# Patient Record
Sex: Female | Born: 1985 | Race: Asian | Hispanic: No | Marital: Single | Smoking: Never smoker
Health system: Southern US, Community
[De-identification: ages and names within clinical notes are randomized; demographics above are authoritative.]

## PROBLEM LIST (undated history)

## (undated) DIAGNOSIS — Z789 Other specified health status: Secondary | ICD-10-CM

## (undated) DIAGNOSIS — G039 Meningitis, unspecified: Secondary | ICD-10-CM

## (undated) HISTORY — PX: NO PAST SURGERIES: SHX2092

---

## 2009-09-27 ENCOUNTER — Emergency Department (HOSPITAL_COMMUNITY): Admission: EM | Admit: 2009-09-27 | Discharge: 2009-09-27 | Payer: Self-pay | Admitting: Emergency Medicine

## 2010-05-18 ENCOUNTER — Emergency Department (HOSPITAL_COMMUNITY): Admission: EM | Admit: 2010-05-18 | Discharge: 2010-05-18 | Payer: Self-pay | Admitting: Family Medicine

## 2010-05-24 ENCOUNTER — Ambulatory Visit: Payer: Self-pay | Admitting: Internal Medicine

## 2010-05-24 ENCOUNTER — Inpatient Hospital Stay (HOSPITAL_COMMUNITY): Admission: EM | Admit: 2010-05-24 | Discharge: 2010-05-27 | Payer: Self-pay | Admitting: Emergency Medicine

## 2010-06-04 ENCOUNTER — Ambulatory Visit: Payer: Self-pay | Admitting: Internal Medicine

## 2010-07-23 ENCOUNTER — Ambulatory Visit: Payer: Self-pay | Admitting: Internal Medicine

## 2010-07-23 ENCOUNTER — Encounter (INDEPENDENT_AMBULATORY_CARE_PROVIDER_SITE_OTHER): Payer: Self-pay | Admitting: Family Medicine

## 2010-07-23 LAB — CONVERTED CEMR LAB: GC Probe Amp, Genital: NEGATIVE

## 2010-09-27 ENCOUNTER — Emergency Department (HOSPITAL_COMMUNITY)
Admission: EM | Admit: 2010-09-27 | Discharge: 2010-09-27 | Payer: Self-pay | Source: Home / Self Care | Admitting: Emergency Medicine

## 2010-09-29 ENCOUNTER — Emergency Department (HOSPITAL_COMMUNITY)
Admission: EM | Admit: 2010-09-29 | Discharge: 2010-09-29 | Payer: Self-pay | Source: Home / Self Care | Admitting: Emergency Medicine

## 2010-11-21 ENCOUNTER — Inpatient Hospital Stay (INDEPENDENT_AMBULATORY_CARE_PROVIDER_SITE_OTHER)
Admission: RE | Admit: 2010-11-21 | Discharge: 2010-11-21 | Disposition: A | Discharge status: Home / Self Care | Payer: Self-pay | Source: Ambulatory Visit | Attending: Family Medicine | Admitting: Family Medicine

## 2010-11-21 DIAGNOSIS — R300 Dysuria: Secondary | ICD-10-CM

## 2010-11-21 LAB — POCT URINALYSIS DIPSTICK
Ketones, ur: NEGATIVE mg/dL
Nitrite: NEGATIVE
Protein, ur: 100 mg/dL — AB
Specific Gravity, Urine: 1.025 (ref 1.005–1.030)
Urine Glucose, Fasting: NEGATIVE mg/dL

## 2010-11-23 LAB — URINE CULTURE
Colony Count: >=100000
Culture  Setup Time: 201202192139

## 2010-12-13 LAB — BASIC METABOLIC PANEL
BUN: 6 mg/dL (ref 6–23)
Chloride: 107 mEq/L (ref 96–112)
Creatinine, Ser: 0.69 mg/dL (ref 0.4–1.2)
GFR calc Af Amer: >60 mL/min (ref >60)
Glucose, Bld: 94 mg/dL (ref 70–99)
Potassium: 4.1 mEq/L (ref 3.5–5.1)
Sodium: 138 mEq/L (ref 135–145)

## 2010-12-13 LAB — DIFFERENTIAL
Basophils Relative: 0 % (ref 0–1)
Eosinophils Absolute: 0 10*3/uL (ref 0.0–0.7)
Eosinophils Relative: 0 % (ref 0–5)
Lymphocytes Relative: 16 % (ref 12–46)
Lymphs Abs: 1.4 10*3/uL (ref 0.7–4.0)
Monocytes Absolute: 1 10*3/uL (ref 0.1–1.0)
Neutro Abs: 6.8 10*3/uL (ref 1.7–7.7)

## 2010-12-13 LAB — CBC
HCT: 36.8 % (ref 36.0–46.0)
Platelets: 153 10*3/uL (ref 150–400)
RDW: 13.4 % (ref 11.5–15.5)
WBC: 9.2 10*3/uL (ref 4.0–10.5)

## 2010-12-17 LAB — URINE MICROSCOPIC-ADD ON

## 2010-12-17 LAB — URINALYSIS, ROUTINE W REFLEX MICROSCOPIC
Bilirubin Urine: NEGATIVE
Ketones, ur: NEGATIVE mg/dL
Nitrite: NEGATIVE
Protein, ur: NEGATIVE mg/dL
Specific Gravity, Urine: 1.011 (ref 1.005–1.030)
Urobilinogen, UA: 1 mg/dL (ref 0.0–1.0)

## 2010-12-17 LAB — DIFFERENTIAL
Band Neutrophils: 0 % (ref 0–10)
Basophils Absolute: 0.1 10*3/uL (ref 0.0–0.1)
Blasts: 0 %
Eosinophils Absolute: 0.1 10*3/uL (ref 0.0–0.7)
Lymphocytes Relative: 29 % (ref 12–46)
Lymphs Abs: 2.4 10*3/uL (ref 0.7–4.0)
Monocytes Relative: 3 % (ref 3–12)
Promyelocytes Absolute: 0 %
nRBC: 0 /100 WBC

## 2010-12-17 LAB — GRAM STAIN

## 2010-12-17 LAB — AFB CULTURE WITH SMEAR (NOT AT ARMC)

## 2010-12-17 LAB — CSF CELL COUNT WITH DIFFERENTIAL
Eosinophils, CSF: 1 % (ref 0–1)
Lymphs, CSF: 89 % — ABNORMAL HIGH (ref 40–80)
Lymphs, CSF: 92 % — ABNORMAL HIGH (ref 40–80)
Monocyte-Macrophage-Spinal Fluid: 6 % — ABNORMAL LOW (ref 15–45)
RBC Count, CSF: 2225 /mm3 — ABNORMAL HIGH
RBC Count, CSF: 3225 /mm3 — ABNORMAL HIGH
Segmented Neutrophils-CSF: 1 % (ref 0–6)
Segmented Neutrophils-CSF: 5 % (ref 0–6)
Tube #: 1
WBC, CSF: 270 /mm3 (ref 0–5)

## 2010-12-17 LAB — CULTURE, BLOOD (ROUTINE X 2)
Culture: NO GROWTH
Culture: NO GROWTH

## 2010-12-17 LAB — CBC
HCT: 34.4 % — ABNORMAL LOW (ref 36.0–46.0)
HCT: 36.4 % (ref 36.0–46.0)
HCT: 39.7 % (ref 36.0–46.0)
Hemoglobin: 11.3 g/dL — ABNORMAL LOW (ref 12.0–15.0)
Hemoglobin: 12 g/dL (ref 12.0–15.0)
Hemoglobin: 13.2 g/dL (ref 12.0–15.0)
MCH: 26.5 pg (ref 26.0–34.0)
MCV: 79.7 fL (ref 78.0–100.0)
MCV: 80 fL (ref 78.0–100.0)
RBC: 4.28 MIL/uL (ref 3.87–5.11)
RBC: 4.98 MIL/uL (ref 3.87–5.11)
RDW: 13.2 % (ref 11.5–15.5)
WBC: 10.5 10*3/uL (ref 4.0–10.5)

## 2010-12-17 LAB — PREGNANCY, URINE: Preg Test, Ur: NEGATIVE

## 2010-12-17 LAB — BASIC METABOLIC PANEL
BUN: 5 mg/dL — ABNORMAL LOW (ref 6–23)
CO2: 23 mEq/L (ref 19–32)
Calcium: 8.3 mg/dL — ABNORMAL LOW (ref 8.4–10.5)
GFR calc Af Amer: >60 mL/min (ref >60)
GFR calc Af Amer: >60 mL/min (ref >60)
GFR calc non Af Amer: >60 mL/min (ref >60)
GFR calc non Af Amer: >60 mL/min (ref >60)
Potassium: 4.2 mEq/L (ref 3.5–5.1)
Sodium: 135 mEq/L (ref 135–145)
Sodium: 137 mEq/L (ref 135–145)

## 2010-12-17 LAB — HEPATIC FUNCTION PANEL
ALT: 19 U/L (ref 0–35)
AST: 21 U/L (ref 0–37)
Total Protein: 8 g/dL (ref 6.0–8.3)

## 2010-12-17 LAB — CRYPTOCOCCAL ANTIGEN, CSF: Crypto Ag: NEGATIVE

## 2010-12-17 LAB — HERPES SIMPLEX VIRUS(HSV) DNA BY PCR: HSV 1 DNA: NOT DETECTED

## 2010-12-17 LAB — CSF CULTURE W GRAM STAIN: Culture: NO GROWTH

## 2010-12-17 LAB — MISCELLANEOUS TEST

## 2010-12-17 LAB — VANCOMYCIN, TROUGH: Vancomycin Tr: 8.1 ug/mL — ABNORMAL LOW (ref 10.0–20.0)

## 2010-12-17 LAB — PROTEIN, CSF: Total  Protein, CSF: 57 mg/dL — ABNORMAL HIGH (ref 15–45)

## 2013-01-07 ENCOUNTER — Ambulatory Visit: Payer: Self-pay | Admitting: Family Medicine

## 2013-01-07 VITALS — BP 105/77 | HR 66 | Temp 98.0°F | Resp 18 | Wt 105.0 lb

## 2013-01-07 DIAGNOSIS — N926 Irregular menstruation, unspecified: Secondary | ICD-10-CM

## 2013-01-07 DIAGNOSIS — B373 Candidiasis of vulva and vagina: Secondary | ICD-10-CM

## 2013-01-07 DIAGNOSIS — N921 Excessive and frequent menstruation with irregular cycle: Secondary | ICD-10-CM

## 2013-01-07 LAB — POCT CBC
HCT, POC: 42 % (ref 37.7–47.9)
Lymph, poc: 1.9 (ref 0.6–3.4)
MCH, POC: 26.4 pg — AB (ref 27–31.2)
MCHC: 31.7 g/dL — AB (ref 31.8–35.4)
MCV: 83.4 fL (ref 80–97)
POC LYMPH PERCENT: 42.2 %L (ref 10–50)
RDW, POC: 13.1 %
WBC: 4.6 10*3/uL (ref 4.6–10.2)

## 2013-01-07 LAB — POCT URINE PREGNANCY: Preg Test, Ur: NEGATIVE

## 2013-01-07 NOTE — Progress Notes (Signed)
Subjective:    Patient ID: Kaitlyn Robinson, female    DOB: 07/14/1986, 27 y.o.   MRN: [id number] Chief Complaint  Patient presents with  . Annual Exam    PAP smear    HPI  Gets yeast infection often - has had more than 5-6 times in the past yr. Started a yr ago. Was diagnosed initially at a clinic.  Recently, she gets spotting frequently - around the 18th or 20th she had spotting for 2-3d, then period was last wk and now she has started spotting again.  Makes her very nervous.  She does have bleeding sometimes during sex - and then can last for 2-3d but will go away so doesn't think its her period. She was prev on birth control pills - initially was doing the patch - but did go off of it for a while as her period was very heavy on OCPs- this was about 2 years ago.  She did try the depo shot in 2012 once or twice but has not been on anything since as she states she is getting to the age where she should think about getting pregnant. She has gotten her HPV vaccines.  She has never hand an abnormal pap smear and Last pap smear was 1 yr ago and was normal.  She has only had 1 sexual partner and her partner has only slept with her. They have been together since they were 44 yo - grew up together as best friends.  Does not think she is pregnant.  Does not have pain with sex.  Her recurrent yeast infections are not very itchy but usu just present with thick amount of vaginal discharge at times - eats a lot of yogurt and will try vagisil unless she goes to the doctor to get diflucan.  Has had many yeast infections prior but has not had them checked out in the past year.  Otherwise not eating much yogurt or other probiotics, no douches, bubble baths, etc.    History   Social History  . Marital Status: Single    Spouse Name: N/A    Number of Children: N/A  . Years of Education: N/A   Social History Main Topics  . Smoking status: Never Smoker   . Smokeless tobacco: None  . Alcohol Use: No  . Drug Use: No   . Sexually Active: Yes    Birth Control/ Protection: Condom   Other Topics Concern  . None   Social History Narrative  . None     Review of Systems  Constitutional: Negative for fever, chills, diaphoresis, activity change, appetite change, fatigue and unexpected weight change.  Gastrointestinal: Negative for abdominal pain, diarrhea, constipation, blood in stool, anal bleeding and rectal pain.  Genitourinary: Positive for vaginal discharge and menstrual problem. Negative for dysuria, urgency, frequency, hematuria, decreased urine volume, vaginal bleeding, difficulty urinating, genital sores, vaginal pain, pelvic pain and dyspareunia.  Musculoskeletal: Negative for gait problem.  Skin: Negative for rash.  Hematological: Negative for adenopathy.  Psychiatric/Behavioral: The patient is not nervous/anxious.       BP 105/77  Pulse 66  Temp(Src) 98 F (36.7 C) (Oral)  Resp 18  Wt 105 lb (47.628 kg)  LMP 01/02/2013 Objective:   Physical Exam  Constitutional: She is oriented to person, place, and time. She appears well-developed and well-nourished. No distress.  HENT:  Head: Normocephalic and atraumatic.  Cardiovascular: Normal rate, regular rhythm, normal heart sounds and intact distal pulses.   Pulmonary/Chest: Effort normal and breath  sounds normal.  Abdominal: Soft. Bowel sounds are normal. She exhibits no distension. There is no tenderness. There is no rebound and no guarding.  Genitourinary: Uterus normal. Pelvic exam was performed with patient supine. There is no rash, tenderness or lesion on the right labia. There is no rash, tenderness or lesion on the left labia. Uterus is not tender. Cervix exhibits no motion tenderness and no friability. Right adnexum displays no mass, no tenderness and no fullness. Left adnexum displays no mass, no tenderness and no fullness. No erythema or tenderness around the vagina. No vaginal discharge found.  Small amount of bright red cervical blood  coming from os. No other discharge seen.  Lymphadenopathy:       Right: No inguinal adenopathy present.       Left: No inguinal adenopathy present.  Neurological: She is alert and oriented to person, place, and time.  Skin: Skin is warm and dry. She is not diaphoretic.  Psychiatric: She has a normal mood and affect. Her behavior is normal.   Results for orders placed in visit on 01/07/13  POCT URINE PREGNANCY      Result Value Range   Preg Test, Ur Negative    POCT CBC      Result Value Range   WBC 4.6  4.6 - 10.2 K/uL   Lymph, poc 1.9  0.6 - 3.4   POC LYMPH PERCENT 42.2  10 - 50 %L   MID (cbc) 0.4  0 - 0.9   POC MID % 9.1  0 - 12 %M   POC Granulocyte 2.2  2 - 6.9   Granulocyte percent 48.7  37 - 80 %G   RBC 5.04  4.04 - 5.48 M/uL   Hemoglobin 13.3  12.2 - 16.2 g/dL   HCT, POC 46.9  62.9 - 47.9 %   MCV 83.4  80 - 97 fL   MCH, POC 26.4 (*) 27 - 31.2 pg   MCHC 31.7 (*) 31.8 - 35.4 g/dL   RDW, POC 52.8     Platelet Count, POC 248  142 - 424 K/uL   MPV 9.3  0 - 99.8 fL       Assessment & Plan:  Irregular menstrual cycle - Plan: POCT urine pregnancy, TSH, POCT CBC, CANCELED: POCT urinalysis dipstick, CANCELED: POCT UA - Microscopic Only Pt does not have health insurance so requested that eval be done with minimal cost as possible. Will do stepwise eval - check tsh. If normal, consider adding on prolactin. Pt will RTC for recheck when not having vaginal bleeding as I would like to do a pelvic exam then to see if she is having any cervical inflammation, discharge, or friability.  Held off on gc/chlam probe today as pt is a very low risk for stds but consider obtaining at f/u.  If w/u cont to be neg, consider fsh/lh testing and pelvic US.

## 2013-01-07 NOTE — Patient Instructions (Addendum)
Metrorrhagia   Metrorrhagia is uterine bleeding at irregular intervals, especially between menstrual periods.   CAUSES    Dysfunctional uterine bleeding.   Uterine lining growing outside the uterus (endometriosis).   Embryo adhering to uterine wall (implantation).   Pregnancy growing in the fallopian tubes (ectopic pregnancy).   Miscarriage.   Menopause.   Cancer of the reproduction organs.   Certain drugs such as hormonal contraceptives.   Inherited bleeding disorders.   Trauma.   Uterine fibroids.   Sexually transmitted diseases (STDs).   Polycystic ovarian disease.  DIAGNOSIS   A history will be taken.   A physical exam will be performed.   Other tests may include:   Blood tests.   A pregnancy test.   An ultrasound of the abdomen and pelvis.   A biopsy of the uterine lining.   AMRI or CT scan of the abdomen and pelvis.  TREATMENT  Treatment will depend on the cause.  HOME CARE INSTRUCTIONS    Take all medicines as directed by your caregiver. Do not change or switch medicines without talking to your caregiver.   Take all iron supplements exactly as directed by your caregiver. Iron supplements help to replace the iron your body loses from irregular bleeding.If you become constipated, increase the amount of fiber, fruits, and vegetables in your diet.   Do not take aspirin or medicines that contain aspirin for 1 week before your menstrual period or during your menstrual period. Aspirin may increase the bleeding.   Rest as much as possible if you change your sanitary pad or tampon more than once every 2 hours.   Eat well-balanced meals including foods high in iron, such as green leafy vegetables, red meat, liver, eggs, and whole-grain breads and cereals.   Do not try to lose weight until the abnormal bleeding is controlled and your blood iron level is back to normal.  SEEK MEDICAL CARE IF:    You have nausea and vomiting, or you cannot keep foods down.   You feel dizzy or have diarrhea  while taking medicine.   You have any problems that may be related to the medicine you are taking.  SEEK IMMEDIATE MEDICAL CARE IF:    You have a fever.   You develop chills.   You become lightheaded or faint.   You need to change your sanitary pad or tampon more than once an hour.   Your bleeding becomesheavy.   You begin to pass clots or tissue.  MAKE SURE YOU:    Understand these instructions.   Will watch your condition.   Will get help right away if you are not doing well or get worse.  Document Released: 09/19/2005 Document Revised: 12/12/2011 Document Reviewed: 04/18/2011  [employer] Patient Information 2013 [employer].

## 2013-01-08 LAB — TSH: TSH: 2.986 u[IU]/mL (ref 0.350–4.500)

## 2017-06-13 ENCOUNTER — Encounter: Payer: Self-pay | Admitting: Obstetrics and Gynecology

## 2017-06-13 ENCOUNTER — Ambulatory Visit (INDEPENDENT_AMBULATORY_CARE_PROVIDER_SITE_OTHER): Payer: Medicaid Other

## 2017-06-13 ENCOUNTER — Other Ambulatory Visit (HOSPITAL_COMMUNITY)
Admission: RE | Admit: 2017-06-13 | Discharge: 2017-06-13 | Disposition: A | Discharge status: Home / Self Care | Payer: Medicaid Other | Source: Ambulatory Visit | Attending: Obstetrics and Gynecology | Admitting: Obstetrics and Gynecology

## 2017-06-13 VITALS — BP 108/67 | HR 86 | Ht 60.0 in | Wt 112.8 lb

## 2017-06-13 DIAGNOSIS — Z3200 Encounter for pregnancy test, result unknown: Secondary | ICD-10-CM | POA: Diagnosis present

## 2017-06-13 DIAGNOSIS — N898 Other specified noninflammatory disorders of vagina: Secondary | ICD-10-CM | POA: Insufficient documentation

## 2017-06-13 DIAGNOSIS — Z3201 Encounter for pregnancy test, result positive: Secondary | ICD-10-CM | POA: Diagnosis not present

## 2017-06-13 DIAGNOSIS — Z3202 Encounter for pregnancy test, result negative: Secondary | ICD-10-CM

## 2017-06-13 DIAGNOSIS — B373 Candidiasis of vulva and vagina: Secondary | ICD-10-CM | POA: Diagnosis not present

## 2017-06-13 LAB — POCT URINE PREGNANCY: Preg Test, Ur: POSITIVE — AB

## 2017-06-13 MED ORDER — VITAFOL GUMMIES 3.33-0.333-34.8 MG PO CHEW: 75.0000 mg | CHEWABLE_TABLET | Freq: Every morning | ORAL | 10 refills | Status: DC

## 2017-06-13 NOTE — Progress Notes (Signed)
Patient presents for pregnancy test and self swab.  She is having discharge and itching. Denies odor and burning.  UPT is POSITIVE. LMP 04/17/17. EDD 01/22/18 @ 8w 1day  Advised to make NOB appointment.  SUBJECTIVE:  31 y.o. female complains of white vaginal discharge for 7day(s). Denies abnormal vaginal bleeding or significant pelvic pain or fever. No UTI symptoms. Denies history of known exposure to STD.  Patient's last menstrual period was 04/17/2017 (exact date).  OBJECTIVE:  She appears well, afebrile. Urine dipstick: not done.  ASSESSMENT:  Vaginal Discharge and itching.  Denies vaginal Odor   PLAN:  GC, chlamydia, trichomonas, BVAG, CVAG probe sent to lab. Treatment: To be determined once lab results are received ROV prn if symptoms persist or worsen.

## 2017-06-14 LAB — CERVICOVAGINAL ANCILLARY ONLY
Bacterial vaginitis: NEGATIVE
CANDIDA VAGINITIS: POSITIVE — AB
CHLAMYDIA, DNA PROBE: NEGATIVE
Neisseria Gonorrhea: NEGATIVE
TRICH (WINDOWPATH): NEGATIVE

## 2017-06-15 ENCOUNTER — Other Ambulatory Visit: Payer: Self-pay | Admitting: *Deleted

## 2017-06-15 ENCOUNTER — Encounter: Payer: Self-pay | Admitting: Obstetrics and Gynecology

## 2017-06-15 DIAGNOSIS — B379 Candidiasis, unspecified: Secondary | ICD-10-CM

## 2017-06-15 MED ORDER — TERCONAZOLE 0.4 % VA CREA: 1.0000 | TOPICAL_CREAM | Freq: Every day | VAGINAL | 0 refills | Status: DC

## 2017-06-15 NOTE — Progress Notes (Signed)
See lab note.  

## 2017-06-20 ENCOUNTER — Encounter: Payer: Self-pay | Admitting: Obstetrics and Gynecology

## 2017-07-03 ENCOUNTER — Encounter: Payer: Self-pay | Admitting: Obstetrics and Gynecology

## 2017-07-03 ENCOUNTER — Other Ambulatory Visit (HOSPITAL_COMMUNITY)
Admission: RE | Admit: 2017-07-03 | Discharge: 2017-07-03 | Disposition: A | Discharge status: Home / Self Care | Payer: Medicaid Other | Source: Ambulatory Visit | Attending: Obstetrics and Gynecology | Admitting: Obstetrics and Gynecology

## 2017-07-03 ENCOUNTER — Ambulatory Visit (INDEPENDENT_AMBULATORY_CARE_PROVIDER_SITE_OTHER): Payer: Medicaid Other | Admitting: Obstetrics and Gynecology

## 2017-07-03 DIAGNOSIS — Z3401 Encounter for supervision of normal first pregnancy, first trimester: Secondary | ICD-10-CM | POA: Diagnosis present

## 2017-07-03 DIAGNOSIS — Z3A11 11 weeks gestation of pregnancy: Secondary | ICD-10-CM | POA: Insufficient documentation

## 2017-07-03 DIAGNOSIS — Z23 Encounter for immunization: Secondary | ICD-10-CM | POA: Diagnosis not present

## 2017-07-03 DIAGNOSIS — Z3400 Encounter for supervision of normal first pregnancy, unspecified trimester: Secondary | ICD-10-CM | POA: Insufficient documentation

## 2017-07-03 NOTE — Progress Notes (Signed)
NOB pt c/o recurrent BV and yeast infections. Denies having infection today.

## 2017-07-03 NOTE — Patient Instructions (Signed)
First Trimester of Pregnancy The first trimester of pregnancy is from week 1 until the end of week 13 (months 1 through 3). A week after a sperm fertilizes an egg, the egg will implant on the wall of the uterus. This embryo will begin to develop into a baby. Genes from you and your partner will form the baby. The female genes will determine whether the baby will be a boy or a girl. At 6-8 weeks, the eyes and face will be formed, and the heartbeat can be seen on ultrasound. At the end of 12 weeks, all the baby's organs will be formed. Now that you are pregnant, you will want to do everything you can to have a healthy baby. Two of the most important things are to get good prenatal care and to follow your health care provider's instructions. Prenatal care is all the medical care you receive before the baby's birth. This care will help prevent, find, and treat any problems during the pregnancy and childbirth. Body changes during your first trimester Your body goes through many changes during pregnancy. The changes vary from woman to woman.  You may gain or lose a couple of pounds at first.  You may feel sick to your stomach (nauseous) and you may throw up (vomit). If the vomiting is uncontrollable, call your health care provider.  You may tire easily.  You may develop headaches that can be relieved by medicines. All medicines should be approved by your health care provider.  You may urinate more often. Painful urination may mean you have a bladder infection.  You may develop heartburn as a result of your pregnancy.  You may develop constipation because certain hormones are causing the muscles that push stool through your intestines to slow down.  You may develop hemorrhoids or swollen veins (varicose veins).  Your breasts may begin to grow larger and become tender. Your nipples may stick out more, and the tissue that surrounds them (areola) may become darker.  Your gums may bleed and may be  sensitive to brushing and flossing.  Dark spots or blotches (chloasma, mask of pregnancy) may develop on your face. This will likely fade after the baby is born.  Your menstrual periods will stop.  You may have a loss of appetite.  You may develop cravings for certain kinds of food.  You may have changes in your emotions from day to day, such as being excited to be pregnant or being concerned that something may go wrong with the pregnancy and baby.  You may have more vivid and strange dreams.  You may have changes in your hair. These can include thickening of your hair, rapid growth, and changes in texture. Some women also have hair loss during or after pregnancy, or hair that feels dry or thin. Your hair will most likely return to normal after your baby is born.  What to expect at prenatal visits During a routine prenatal visit:  You will be weighed to make sure you and the baby are growing normally.  Your blood pressure will be taken.  Your abdomen will be measured to track your baby's growth.  The fetal heartbeat will be listened to between weeks 10 and 14 of your pregnancy.  Test results from any previous visits will be discussed.  Your health care provider may ask you:  How you are feeling.  If you are feeling the baby move.  If you have had any abnormal symptoms, such as leaking fluid, bleeding, severe headaches,   or abdominal cramping.  If you are using any tobacco products, including cigarettes, chewing tobacco, and electronic cigarettes.  If you have any questions.  Other tests that may be performed during your first trimester include:  Blood tests to find your blood type and to check for the presence of any previous infections. The tests will also be used to check for low iron levels (anemia) and protein on red blood cells (Rh antibodies). Depending on your risk factors, or if you previously had diabetes during pregnancy, you may have tests to check for high blood  sugar that affects pregnant women (gestational diabetes).  Urine tests to check for infections, diabetes, or protein in the urine.  An ultrasound to confirm the proper growth and development of the baby.  Fetal screens for spinal cord problems (spina bifida) and Down syndrome.  HIV (human immunodeficiency virus) testing. Routine prenatal testing includes screening for HIV, unless you choose not to have this test.  You may need other tests to make sure you and the baby are doing well.  Follow these instructions at home: Medicines  Follow your health care provider's instructions regarding medicine use. Specific medicines may be either safe or unsafe to take during pregnancy.  Take a prenatal vitamin that contains at least 600 micrograms (mcg) of folic acid.  If you develop constipation, try taking a stool softener if your health care provider approves. Eating and drinking  Eat a balanced diet that includes fresh fruits and vegetables, whole grains, good sources of protein such as meat, eggs, or tofu, and low-fat dairy. Your health care provider will help you determine the amount of weight gain that is right for you.  Avoid raw meat and uncooked cheese. These carry germs that can cause birth defects in the baby.  Eating four or five small meals rather than three large meals a day may help relieve nausea and vomiting. If you start to feel nauseous, eating a few soda crackers can be helpful. Drinking liquids between meals, instead of during meals, also seems to help ease nausea and vomiting.  Limit foods that are high in fat and processed sugars, such as fried and sweet foods.  To prevent constipation: ? Eat foods that are high in fiber, such as fresh fruits and vegetables, whole grains, and beans. ? Drink enough fluid to keep your urine clear or pale yellow. Activity  Exercise only as directed by your health care provider. Most women can continue their usual exercise routine during  pregnancy. Try to exercise for 30 minutes at least 5 days a week. Exercising will help you: ? Control your weight. ? Stay in shape. ? Be prepared for labor and delivery.  Experiencing pain or cramping in the lower abdomen or lower back is a good sign that you should stop exercising. Check with your health care provider before continuing with normal exercises.  Try to avoid standing for long periods of time. Move your legs often if you must stand in one place for a long time.  Avoid heavy lifting.  Wear low-heeled shoes and practice good posture.  You may continue to have sex unless your health care provider tells you not to. Relieving pain and discomfort  Wear a good support bra to relieve breast tenderness.  Take warm sitz baths to soothe any pain or discomfort caused by hemorrhoids. Use hemorrhoid cream if your health care provider approves.  Rest with your legs elevated if you have leg cramps or low back pain.  If you develop   varicose veins in your legs, wear support hose. Elevate your feet for 15 minutes, 3-4 times a day. Limit salt in your diet. Prenatal care  Schedule your prenatal visits by the twelfth week of pregnancy. They are usually scheduled monthly at first, then more often in the last 2 months before delivery.  Write down your questions. Take them to your prenatal visits.  Keep all your prenatal visits as told by your health care provider. This is important. Safety  Wear your seat belt at all times when driving.  Make a list of emergency phone numbers, including numbers for family, friends, the hospital, and police and fire departments. General instructions  Ask your health care provider for a referral to a local prenatal education class. Begin classes no later than the beginning of month 6 of your pregnancy.  Ask for help if you have counseling or nutritional needs during pregnancy. Your health care provider can offer advice or refer you to specialists for help  with various needs.  Do not use hot tubs, steam rooms, or saunas.  Do not douche or use tampons or scented sanitary pads.  Do not cross your legs for long periods of time.  Avoid cat litter boxes and soil used by cats. These carry germs that can cause birth defects in the baby and possibly loss of the fetus by miscarriage or stillbirth.  Avoid all smoking, herbs, alcohol, and medicines not prescribed by your health care provider. Chemicals in these products affect the formation and growth of the baby.  Do not use any products that contain nicotine or tobacco, such as cigarettes and e-cigarettes. If you need help quitting, ask your health care provider. You may receive counseling support and other resources to help you quit.  Schedule a dentist appointment. At home, brush your teeth with a soft toothbrush and be gentle when you floss. Contact a health care provider if:  You have dizziness.  You have mild pelvic cramps, pelvic pressure, or nagging pain in the abdominal area.  You have persistent nausea, vomiting, or diarrhea.  You have a bad smelling vaginal discharge.  You have pain when you urinate.  You notice increased swelling in your face, hands, legs, or ankles.  You are exposed to fifth disease or chickenpox.  You are exposed to German measles (rubella) and have never had it. Get help right away if:  You have a fever.  You are leaking fluid from your vagina.  You have spotting or bleeding from your vagina.  You have severe abdominal cramping or pain.  You have rapid weight gain or loss.  You vomit blood or material that looks like coffee grounds.  You develop a severe headache.  You have shortness of breath.  You have any kind of trauma, such as from a fall or a car accident. Summary  The first trimester of pregnancy is from week 1 until the end of week 13 (months 1 through 3).  Your body goes through many changes during pregnancy. The changes vary from  woman to woman.  You will have routine prenatal visits. During those visits, your health care provider will examine you, discuss any test results you may have, and talk with you about how you are feeling. This information is not intended to replace advice given to you by your health care provider. Make sure you discuss any questions you have with your health care provider. Document Released: 09/13/2001 Document Revised: 08/31/2016 Document Reviewed: 08/31/2016 Elsevier Interactive Patient Education  2017 [employer]  

## 2017-07-03 NOTE — Progress Notes (Signed)
Subjective:  Kaitlyn Robinson is a 31 y.o. G1P0 at [redacted]w[redacted]d being seen today for her first OB visit. She has had some problems with N/V but this is improving. She is tolerating regular diet. She has no chronic medical problems or takes any medication. PSH negative as well. Denies habits.  ongoing prenatal care.  She is currently monitored for the following issues for this low-risk pregnancy and has Supervision of normal first pregnancy on her problem list.  Patient reports nausea.  Contractions: Not present. Vag. Bleeding: None.   . Denies leaking of fluid.   The following portions of the patient's history were reviewed and updated as appropriate: allergies, current medications, past family history, past medical history, past social history, past surgical history and problem list. Problem list updated.  Objective:   Vitals:   07/03/17 1018  BP: 112/72  Pulse: 91  Weight: 112 lb (50.8 kg)    Fetal Status:           General:  Alert, oriented and cooperative. Patient is in no acute distress.  Skin: Skin is warm and dry. No rash noted.   Cardiovascular: Normal heart rate noted  Respiratory: Normal respiratory effort, no problems with respiration noted  Abdomen: Soft, gravid, appropriate for gestational age. Pain/Pressure: Absent     Pelvic:  Cervical exam performed        Extremities: Normal range of motion.  Edema: None  Mental Status: Normal mood and affect. Normal behavior. Normal judgment and thought content.  Breast sym supple no masses or nipple d/c Urinalysis:      Assessment and Plan:  Pregnancy: G1P0 at [redacted]w[redacted]d  1. Encounter for supervision of normal first pregnancy in first trimester Prenatal care and labs reviewed with pt. Flu vaccine today - Culture, OB Urine - Cystic Fibrosis Mutation 97 - Hemoglobinopathy evaluation - Obstetric Panel, Including HIV - Varicella zoster antibody, IgG - Cytology - PAP - Flu Vaccine QUAD 36+ mos IM (Fluarix, Quad PF) - Enroll Patient in  Babyscripts - [place] bedside - MaterniT21 PLUS Core+ESS+SCA  Preterm labor symptoms and general obstetric precautions including but not limited to vaginal bleeding, contractions, leaking of fluid and fetal movement were reviewed in detail with the patient. Please refer to After Visit Summary for other counseling recommendations.  Return in about 4 weeks (around 07/31/2017) for OB visit.   Hermina Staggers, MD

## 2017-07-05 LAB — CULTURE, OB URINE

## 2017-07-05 LAB — URINE CULTURE, OB REFLEX

## 2017-07-05 LAB — CYTOLOGY - PAP
BACTERIAL VAGINITIS: NEGATIVE
CANDIDA VAGINITIS: NEGATIVE
Chlamydia: NEGATIVE
Diagnosis: NEGATIVE
Neisseria Gonorrhea: NEGATIVE
Trichomonas: NEGATIVE

## 2017-07-06 ENCOUNTER — Telehealth: Payer: Self-pay | Admitting: Pediatrics

## 2017-07-06 MED ORDER — AMPICILLIN 500 MG PO CAPS: 500.0000 mg | ORAL_CAPSULE | Freq: Two times a day (BID) | ORAL | 0 refills | Status: AC

## 2017-07-06 NOTE — Telephone Encounter (Signed)
-----   Message from Hermina Staggers, MD sent at 07/05/2017  4:05 PM EDT ----- Ampicillin 500 mg po bid x 7 days for UTI Thanks Casimiro Needle

## 2017-07-11 LAB — OBSTETRIC PANEL, INCLUDING HIV
ANTIBODY SCREEN: NEGATIVE
BASOS: 1 %
Basophils Absolute: 0 10*3/uL (ref 0.0–0.2)
EOS (ABSOLUTE): 0 10*3/uL (ref 0.0–0.4)
EOS: 1 %
HEMATOCRIT: 37.1 % (ref 34.0–46.6)
HIV Screen 4th Generation wRfx: NONREACTIVE
Hemoglobin: 12.3 g/dL (ref 11.1–15.9)
Hepatitis B Surface Ag: NEGATIVE
IMMATURE GRANS (ABS): 0 10*3/uL (ref 0.0–0.1)
Immature Granulocytes: 0 %
LYMPHS: 21 %
Lymphocytes Absolute: 1.3 10*3/uL (ref 0.7–3.1)
MCH: 27 pg (ref 26.6–33.0)
MCHC: 33.2 g/dL (ref 31.5–35.7)
MCV: 81 fL (ref 79–97)
MONOCYTES: 6 %
Monocytes Absolute: 0.4 10*3/uL (ref 0.1–0.9)
Neutrophils Absolute: 4.5 10*3/uL (ref 1.4–7.0)
Neutrophils: 71 %
Platelets: 242 10*3/uL (ref 150–379)
RBC: 4.56 x10E6/uL (ref 3.77–5.28)
RDW: 14.2 % (ref 12.3–15.4)
RPR: NONREACTIVE
RUBELLA: 1.3 {index} (ref >0.99)
Rh Factor: POSITIVE
WBC: 6.3 10*3/uL (ref 3.4–10.8)

## 2017-07-11 LAB — MATERNIT21  PLUS CORE+ESS+SCA, BLOOD
Chromosome 13: NEGATIVE
Chromosome 18: NEGATIVE
Chromosome 21: NEGATIVE
Y Chromosome: NOT DETECTED

## 2017-07-11 LAB — HEMOGLOBINOPATHY EVALUATION
HEMOGLOBIN F QUANTITATION: 0 % (ref 0.0–2.0)
HGB A: 97.6 % (ref 96.4–98.8)
HGB C: 0 %
HGB S: 0 %
HGB VARIANT: 0 %
Hemoglobin A2 Quantitation: 2.4 % (ref 1.8–3.2)

## 2017-07-11 LAB — VARICELLA ZOSTER ANTIBODY, IGG: VARICELLA: 199 {index} (ref >165)

## 2017-07-11 LAB — CYSTIC FIBROSIS MUTATION 97: GENE DIS ANAL CARRIER INTERP BLD/T-IMP: NOT DETECTED

## 2017-07-17 ENCOUNTER — Other Ambulatory Visit: Payer: Self-pay

## 2017-07-17 MED ORDER — FLUCONAZOLE 150 MG PO TABS: 150.0000 mg | ORAL_TABLET | Freq: Once | ORAL | 0 refills | Status: AC

## 2017-07-17 NOTE — Progress Notes (Signed)
Diflucan Rx sent to [facility]  [address] [facility]

## 2017-07-23 ENCOUNTER — Encounter: Payer: Self-pay | Admitting: Obstetrics and Gynecology

## 2017-07-28 ENCOUNTER — Encounter: Payer: Self-pay | Admitting: Obstetrics and Gynecology

## 2017-07-31 ENCOUNTER — Encounter: Payer: Self-pay | Admitting: *Deleted

## 2017-07-31 ENCOUNTER — Other Ambulatory Visit (HOSPITAL_COMMUNITY)
Admission: RE | Admit: 2017-07-31 | Discharge: 2017-07-31 | Disposition: A | Discharge status: Home / Self Care | Payer: Medicaid Other | Source: Ambulatory Visit | Attending: Obstetrics and Gynecology | Admitting: Obstetrics and Gynecology

## 2017-07-31 ENCOUNTER — Ambulatory Visit (INDEPENDENT_AMBULATORY_CARE_PROVIDER_SITE_OTHER): Payer: Medicaid Other | Admitting: Obstetrics and Gynecology

## 2017-07-31 DIAGNOSIS — Z3403 Encounter for supervision of normal first pregnancy, third trimester: Secondary | ICD-10-CM | POA: Diagnosis present

## 2017-07-31 NOTE — Progress Notes (Signed)
Pt c/o outer vulva itching and clear thin vaginal discharge x couple days, denies odor.

## 2017-07-31 NOTE — Progress Notes (Signed)
   PRENATAL VISIT NOTE  Subjective:  Kaitlyn Robinson is a 31 y.o. G1P0 at 7465w0d being seen today for ongoing prenatal care.  She is currently monitored for the following issues for this low-risk pregnancy and has Supervision of normal first pregnancy on her problem list.  Patient reports vaginitis.  Contractions: Not present. Vag. Bleeding: None.   . Denies leaking of fluid.   The following portions of the patient's history were reviewed and updated as appropriate: allergies, current medications, past family history, past medical history, past social history, past surgical history and problem list. Problem list updated.  Objective:   Vitals:   07/31/17 0955  BP: 104/70  Pulse: 90  Weight: 117 lb (53.1 kg)    Fetal Status: Fetal Heart Rate (bpm): 152         General:  Alert, oriented and cooperative. Patient is in no acute distress.  Skin: Skin is warm and dry. No rash noted.   Cardiovascular: Normal heart rate noted  Respiratory: Normal respiratory effort, no problems with respiration noted  Abdomen: Soft, gravid, appropriate for gestational age.  Pain/Pressure: Absent     Pelvic: Cervical exam deferred        Extremities: Normal range of motion.  Edema: None  Mental Status:  Normal mood and affect. Normal behavior. Normal judgment and thought content.   Assessment and Plan:  Pregnancy: G1P0 at 5465w0d  1. Encounter for supervision of normal first pregnancy in third trimester Patient is doing well Wet prep ordered AFP ordered Anatomy ultrasound ordered - US MFM OB COMP + 14 WK; Future  General obstetric precautions including but not limited to vaginal bleeding, contractions, leaking of fluid and fetal movement were reviewed in detail with the patient. Please refer to After Visit Summary for other counseling recommendations.  Return in about 4 weeks (around 08/28/2017) for ROB.   Catalina AntiguaPeggy Donya Tomaro, MD

## 2017-08-01 LAB — CERVICOVAGINAL ANCILLARY ONLY
BACTERIAL VAGINITIS: NEGATIVE
CANDIDA VAGINITIS: NEGATIVE
Chlamydia: NEGATIVE
NEISSERIA GONORRHEA: NEGATIVE
TRICH (WINDOWPATH): NEGATIVE

## 2017-08-02 LAB — AFP, SERUM, OPEN SPINA BIFIDA
AFP MoM: 1.02
AFP Value: 33.5 ng/mL
GEST. AGE ON COLLECTION DATE: 15 wk
Maternal Age At EDD: 30.5 yr
OSBR Risk 1 IN: 10000
Test Results:: NEGATIVE
Weight: 117 [lb_av]

## 2017-08-11 ENCOUNTER — Encounter: Payer: Self-pay | Admitting: Obstetrics and Gynecology

## 2017-08-17 ENCOUNTER — Encounter (HOSPITAL_COMMUNITY): Payer: Self-pay | Admitting: Obstetrics and Gynecology

## 2017-08-28 ENCOUNTER — Other Ambulatory Visit: Payer: Self-pay | Admitting: Obstetrics and Gynecology

## 2017-08-28 ENCOUNTER — Encounter: Payer: Self-pay | Admitting: Obstetrics and Gynecology

## 2017-08-28 ENCOUNTER — Other Ambulatory Visit (HOSPITAL_COMMUNITY)
Admission: RE | Admit: 2017-08-28 | Discharge: 2017-08-28 | Disposition: A | Discharge status: Home / Self Care | Payer: Medicaid Other | Source: Ambulatory Visit | Attending: Obstetrics and Gynecology | Admitting: Obstetrics and Gynecology

## 2017-08-28 ENCOUNTER — Ambulatory Visit (INDEPENDENT_AMBULATORY_CARE_PROVIDER_SITE_OTHER): Payer: Medicaid Other | Admitting: Obstetrics and Gynecology

## 2017-08-28 ENCOUNTER — Ambulatory Visit (HOSPITAL_COMMUNITY)
Admission: RE | Admit: 2017-08-28 | Discharge: 2017-08-28 | Disposition: A | Discharge status: Home / Self Care | Payer: Medicaid Other | Source: Ambulatory Visit | Attending: Obstetrics and Gynecology | Admitting: Obstetrics and Gynecology

## 2017-08-28 DIAGNOSIS — Z3689 Encounter for other specified antenatal screening: Secondary | ICD-10-CM | POA: Diagnosis not present

## 2017-08-28 DIAGNOSIS — Z363 Encounter for antenatal screening for malformations: Secondary | ICD-10-CM | POA: Diagnosis present

## 2017-08-28 DIAGNOSIS — Z369 Encounter for antenatal screening, unspecified: Secondary | ICD-10-CM

## 2017-08-28 DIAGNOSIS — O26899 Other specified pregnancy related conditions, unspecified trimester: Secondary | ICD-10-CM

## 2017-08-28 DIAGNOSIS — O26892 Other specified pregnancy related conditions, second trimester: Secondary | ICD-10-CM | POA: Diagnosis present

## 2017-08-28 DIAGNOSIS — N898 Other specified noninflammatory disorders of vagina: Secondary | ICD-10-CM | POA: Diagnosis not present

## 2017-08-28 DIAGNOSIS — K219 Gastro-esophageal reflux disease without esophagitis: Secondary | ICD-10-CM

## 2017-08-28 DIAGNOSIS — Z3A19 19 weeks gestation of pregnancy: Secondary | ICD-10-CM

## 2017-08-28 DIAGNOSIS — B373 Candidiasis of vulva and vagina: Secondary | ICD-10-CM | POA: Diagnosis not present

## 2017-08-28 DIAGNOSIS — Z3403 Encounter for supervision of normal first pregnancy, third trimester: Secondary | ICD-10-CM

## 2017-08-28 DIAGNOSIS — Z3402 Encounter for supervision of normal first pregnancy, second trimester: Secondary | ICD-10-CM

## 2017-08-28 MED ORDER — RANITIDINE HCL 150 MG PO TABS: 150.0000 mg | ORAL_TABLET | Freq: Two times a day (BID) | ORAL | 3 refills | Status: DC

## 2017-08-28 NOTE — Patient Instructions (Signed)

## 2017-08-28 NOTE — Progress Notes (Signed)
Pt states she she has upper abdominal pain. Pt takes acid reducer for relief.  Pt thinks she may have a yeast infection. c/o: Itchy, thick white discharge wants  To be checked.

## 2017-08-28 NOTE — Progress Notes (Signed)
Subjective:  Kaitlyn Robinson is a 31 y.o. G1P0 at 7621w0d being seen today for ongoing prenatal care.  She is currently monitored for the following issues for this low-risk pregnancy and has Supervision of normal first pregnancy; Vaginal discharge during pregnancy; and GERD (gastroesophageal reflux disease) on their problem list.  Patient reports vaginal discharge.  Contractions: Not present. Vag. Bleeding: None.  Movement: Present. Denies leaking of fluid.   The following portions of the patient's history were reviewed and updated as appropriate: allergies, current medications, past family history, past medical history, past social history, past surgical history and problem list. Problem list updated.  Objective:   Vitals:   08/28/17 1107  BP: 107/74  Pulse: 89  Weight: 121 lb (54.9 kg)    Fetal Status:     Movement: Present     General:  Alert, oriented and cooperative. Patient is in no acute distress.  Skin: Skin is warm and dry. No rash noted.   Cardiovascular: Normal heart rate noted  Respiratory: Normal respiratory effort, no problems with respiration noted  Abdomen: Soft, gravid, appropriate for gestational age. Pain/Pressure: Absent     Pelvic:  Cervical exam deferred        Extremities: Normal range of motion.  Edema: None  Mental Status: Normal mood and affect. Normal behavior. Normal judgment and thought content.   Urinalysis:      Assessment and Plan:  Pregnancy: G1P0 at 821w0d  1. Encounter for supervision of normal first pregnancy in second trimester Stable Anatomy scan today  2. Vaginal discharge during pregnancy in second trimester Self swab collected today - Cervicovaginal ancillary only  3. Gastroesophageal reflux disease without esophagitis  - ranitidine (ZANTAC) 150 MG tablet; Take 1 tablet (150 mg total) by mouth 2 (two) times daily.  Dispense: 60 tablet; Refill: 3  Preterm labor symptoms and general obstetric precautions including but not limited to vaginal  bleeding, contractions, leaking of fluid and fetal movement were reviewed in detail with the patient. Please refer to After Visit Summary for other counseling recommendations.  Return in about 4 weeks (around 09/25/2017) for OB visit.   Hermina StaggersErvin, Lytle [place]burg L, MD

## 2017-08-28 NOTE — Addendum Note (Signed)
Addended by: Tim LairLARK, Sharlize Hoar on: 08/28/2017 11:54 AM   Modules accepted: Orders

## 2017-08-29 LAB — CERVICOVAGINAL ANCILLARY ONLY
Bacterial vaginitis: NEGATIVE
Candida vaginitis: POSITIVE — AB

## 2017-09-01 ENCOUNTER — Other Ambulatory Visit: Payer: Self-pay

## 2017-09-01 ENCOUNTER — Telehealth: Payer: Self-pay

## 2017-09-01 MED ORDER — TERCONAZOLE 0.4 % VA CREA: 1.0000 | TOPICAL_CREAM | Freq: Every day | VAGINAL | 0 refills | Status: AC

## 2017-09-01 NOTE — Telephone Encounter (Signed)
-----   Message from Hermina StaggersMichael L Ervin, MD sent at 09/01/2017  9:13 AM EST ----- Terazol vaginal cream 1 applicator qhs x 7 days for yeast Thanks Casimiro NeedleMichael

## 2017-09-01 NOTE — Telephone Encounter (Signed)
Patient notified of results and Rx. 

## 2017-09-04 ENCOUNTER — Telehealth: Payer: Self-pay

## 2017-09-04 NOTE — Telephone Encounter (Signed)
Pt called and left message on Friday wanting to confirm where rx was sent. Left detailed message on vm with this information

## 2017-09-27 ENCOUNTER — Ambulatory Visit (INDEPENDENT_AMBULATORY_CARE_PROVIDER_SITE_OTHER): Payer: Medicaid Other | Admitting: Certified Nurse Midwife

## 2017-09-27 ENCOUNTER — Encounter: Payer: Self-pay | Admitting: Certified Nurse Midwife

## 2017-09-27 VITALS — BP 103/65 | HR 89 | Wt 127.0 lb

## 2017-09-27 DIAGNOSIS — K219 Gastro-esophageal reflux disease without esophagitis: Secondary | ICD-10-CM

## 2017-09-27 DIAGNOSIS — Z3402 Encounter for supervision of normal first pregnancy, second trimester: Secondary | ICD-10-CM

## 2017-09-27 DIAGNOSIS — Z3400 Encounter for supervision of normal first pregnancy, unspecified trimester: Secondary | ICD-10-CM

## 2017-09-27 MED ORDER — FAMOTIDINE 40 MG PO TABS: 40.0000 mg | ORAL_TABLET | Freq: Every day | ORAL | 2 refills | Status: DC

## 2017-09-27 NOTE — Progress Notes (Signed)
ROB c/o: pain upper left  abdominal pain.no relief w/ Rx.

## 2017-09-27 NOTE — Progress Notes (Signed)
   PRENATAL VISIT NOTE  Subjective:  Kaitlyn Robinson is a 31 y.o. G1P0 at 3590w2d being seen today for ongoing prenatal care.  She is currently monitored for the following issues for this low-risk pregnancy and has Supervision of normal first pregnancy; Vaginal discharge during pregnancy; and GERD (gastroesophageal reflux disease) on their problem list.  Patient reports heartburn - currently has Zantac prescribed for GERD. She reports mild relief with medication but does not completely resolve symptoms.  Contractions: Not present. Vag. Bleeding: None.  Movement: Present. Denies leaking of fluid.   The following portions of the patient's history were reviewed and updated as appropriate: allergies, current medications, past family history, past medical history, past social history, past surgical history and problem list. Problem list updated.  Objective:   Vitals:   09/27/17 1127  BP: 103/65  Pulse: 89  Weight: 127 lb (57.6 kg)    Fetal Status: Fetal Heart Rate (bpm): 155; doppler Fundal Height: 22 cm Movement: Present     General:  Alert, oriented and cooperative. Patient is in no acute distress.  Skin: Skin is warm and dry. No rash noted.   Cardiovascular: Normal heart rate noted  Respiratory: Normal respiratory effort, no problems with respiration noted  Abdomen: Soft, gravid, appropriate for gestational age.  Pain/Pressure: Absent     Pelvic: Cervical exam deferred        Extremities: Normal range of motion.  Edema: None  Mental Status:  Normal mood and affect. Normal behavior. Normal judgment and thought content.   Assessment and Plan:  Pregnancy: G1P0 at 5290w2d  1. Supervision of normal first pregnancy, antepartum - US MFM OB FOLLOW UP; Future for completion of anatomy scan   2. Gastroesophageal reflux disease without esophagitis -Educated on heartburn during pregnancy, discussed current food choices and changes in food that can help with prevention. Discussed use of Pepcid during  pregnancy- patient agrees to plan of care. - famotidine (PEPCID) 40 MG tablet; Take 1 tablet (40 mg total) by mouth daily.  Dispense: 30 tablet; Refill: 2   Preterm labor symptoms and general obstetric precautions including but not limited to vaginal bleeding, contractions, leaking of fluid and fetal movement were reviewed in detail with the patient. Please refer to After Visit Summary for other counseling recommendations.  Return in about 4 weeks (around 10/25/2017) for ROB/2hrGTT/3TL.  U/S will call to schedule completion of anatomy scan   Sharyon CableVeronica C Javier Mamone, CNM

## 2017-09-27 NOTE — Patient Instructions (Signed)
Second Trimester of Pregnancy The second trimester is from week 13 through week 28, month 4 through 6. This is often the time in pregnancy that you feel your best. Often times, morning sickness has lessened or quit. You may have more energy, and you may get hungry more often. Your unborn baby (fetus) is growing rapidly. At the end of the sixth month, he or she is about 9 inches long and weighs about 1 pounds. You will likely feel the baby move (quickening) between 18 and 20 weeks of pregnancy. Follow these instructions at home: Avoid all smoking, herbs, and alcohol. Avoid drugs not approved by your doctor. Do not use any tobacco products, including cigarettes, chewing tobacco, and electronic cigarettes. If you need help quitting, ask your doctor. You may get counseling or other support to help you quit. Only take medicine as told by your doctor. Some medicines are safe and some are not during pregnancy. Exercise only as told by your doctor. Stop exercising if you start having cramps. Eat regular, healthy meals. Wear a good support bra if your breasts are tender. Do not use hot tubs, steam rooms, or saunas. Wear your seat belt when driving. Avoid raw meat, uncooked cheese, and liter boxes and soil used by cats. Take your prenatal vitamins. Take 1500-2000 milligrams of calcium daily starting at the 20th week of pregnancy until you deliver your baby. Try taking medicine that helps you poop (stool softener) as needed, and if your doctor approves. Eat more fiber by eating fresh fruit, vegetables, and whole grains. Drink enough fluids to keep your pee (urine) clear or pale yellow. Take warm water baths (sitz baths) to soothe pain or discomfort caused by hemorrhoids. Use hemorrhoid cream if your doctor approves. If you have puffy, bulging veins (varicose veins), wear support hose. Raise (elevate) your feet for 15 minutes, 3-4 times a day. Limit salt in your diet. Avoid heavy lifting, wear low heals, and  sit up straight. Rest with your legs raised if you have leg cramps or low back pain. Visit your dentist if you have not gone during your pregnancy. Use a soft toothbrush to brush your teeth. Be gentle when you floss. You can have sex (intercourse) unless your doctor tells you not to. Go to your doctor visits. Get help if: You feel dizzy. You have mild cramps or pressure in your lower belly (abdomen). You have a nagging pain in your belly area. You continue to feel sick to your stomach (nauseous), throw up (vomit), or have watery poop (diarrhea). You have bad smelling fluid coming from your vagina. You have pain with peeing (urination). Get help right away if: You have a fever. You are leaking fluid from your vagina. You have spotting or bleeding from your vagina. You have severe belly cramping or pain. You lose or gain weight rapidly. You have trouble catching your breath and have chest pain. You notice sudden or extreme puffiness (swelling) of your face, hands, ankles, feet, or legs. You have not felt the baby move in over an hour. You have severe headaches that do not go away with medicine. You have vision changes. This information is not intended to replace advice given to you by your health care provider. Make sure you discuss any questions you have with your health care provider. Document Released: 12/14/2009 Document Revised: 02/25/2016 Document Reviewed: 11/20/2012 Elsevier Interactive Patient Education  2017 [employer] Heartburn During Pregnancy Heartburn is pain or discomfort in the throat or chest. It may cause a  burning feeling. It happens when stomach acid moves up into the tube that carries food from your mouth to your stomach (esophagus). Heartburn is common during pregnancy. It usually goes away or gets better after giving birth. Follow these instructions at home: Eating and drinking  Do not drink alcohol while you are pregnant.  Figure out which foods and  beverages make you feel worse, and avoid them.  Beverages that you may want to avoid include: ? Coffee and tea (with or without caffeine). ? Energy drinks and sports drinks. ? Bubbly (carbonated) drinks or sodas. ? Citrus fruit juices.  Foods that you may want to avoid include: ? Chocolate and cocoa. ? Peppermint and mint flavorings. ? Garlic, onions, and horseradish. ? Spicy and acidic foods. These include peppers, chili powder, curry powder, vinegar, hot sauces, and barbecue sauce. ? Citrus fruits, such as oranges, lemons, and limes. ? Tomato-based foods, such as red sauce, chili, and salsa. ? Fried and fatty foods, such as donuts, french fries, potato chips, and high-fat dressings. ? High-fat meats, such as hot dogs, cold cuts, sausage, ham, and bacon. ? High-fat dairy items, such as whole milk, butter, and cheese.  Eat small meals often, instead of large meals.  Avoid drinking a lot of liquid with your meals.  Avoid eating meals during the 2-3 hours before you go to bed.  Avoid lying down right after you eat.  Do not exercise right after you eat. Medicines  Take over-the-counter and prescription medicines only as told by your doctor.  Do not take aspirin, ibuprofen, or other NSAIDs unless your doctor tells you to do that.  Your doctor may tell you to avoid medicines that have sodium bicarbonate in them. General instructions  If told, raise the head of your bed about 6 inches (15 cm). You can do this by putting blocks under the legs. Sleeping with more pillows does not help with heartburn.  Do not use any products that contain nicotine or tobacco, such as cigarettes and e-cigarettes. If you need help quitting, ask your doctor.  Wear loose-fitting clothing.  Try to lower your stress, such as with yoga or meditation. If you need help, ask your doctor.  Stay at a healthy weight. If you are overweight, work with your doctor to safely lose weight.  Keep all follow-up  visits as told by your doctor. This is important. Contact a doctor if:  You get new symptoms.  Your symptoms do not get better with treatment.  You have weight loss and you do not know why.  You have trouble swallowing.  You make loud sounds when you breathe (wheeze).  You have a cough that does not go away.  You have heartburn often for more than 2 weeks.  You feel sick to your stomach (nauseous), and this does not get better with treatment.  You are throwing up (vomiting), and this does not get better with treatment.  You have pain in your belly (abdomen). Get help right away if:  You have very bad chest pain that spreads to your arm, neck, or jaw.  You feel sweaty, dizzy, or light-headed.  You have trouble breathing.  You have pain when swallowing.  You throw up and your throw-up looks like blood or coffee grounds.  Your poop (stool) is bloody or black. This information is not intended to replace advice given to you by your health care provider. Make sure you discuss any questions you have with your health care provider. Document Released: 10/22/2010  Document Revised: 06/06/2016 Document Reviewed: 06/06/2016 Elsevier Interactive Patient Education  2017 [employer].

## 2017-10-04 ENCOUNTER — Telehealth: Payer: Self-pay

## 2017-10-04 MED ORDER — OMEPRAZOLE 20 MG PO CPDR: 20.0000 mg | DELAYED_RELEASE_CAPSULE | Freq: Every day | ORAL | 5 refills | Status: DC

## 2017-10-04 NOTE — Telephone Encounter (Signed)
Returned call and pt stated that she has been having a lot of gas pain on her side and ingestion, despite taking rx, sent another rx with provider approval.

## 2017-10-11 ENCOUNTER — Telehealth: Payer: Self-pay | Admitting: Certified Nurse Midwife

## 2017-10-11 ENCOUNTER — Telehealth: Payer: Self-pay

## 2017-10-11 NOTE — Telephone Encounter (Signed)
Pt states that she is having a lot of issues with acid reflux and stomach pain. She states that the omeprazole is not helping her. She describes the pain as a burning, or throbbing sensation on her left upper abdomen. Please advise

## 2017-10-11 NOTE — Telephone Encounter (Signed)
Returned patient call to office on acid reflux. Discussed medications currently prescribed and use of medication. Recommended diet changes and consistent use of new medication for at least one month. If not inproved at next prenatal visit will schedule test for H pylori to rule out peptic ulcer.

## 2017-10-23 ENCOUNTER — Other Ambulatory Visit (HOSPITAL_COMMUNITY)
Admission: RE | Admit: 2017-10-23 | Discharge: 2017-10-23 | Disposition: A | Discharge status: Home / Self Care | Payer: Medicaid Other | Source: Ambulatory Visit | Attending: Certified Nurse Midwife | Admitting: Certified Nurse Midwife

## 2017-10-23 ENCOUNTER — Encounter: Payer: Self-pay | Admitting: Certified Nurse Midwife

## 2017-10-23 ENCOUNTER — Other Ambulatory Visit: Payer: Self-pay

## 2017-10-23 ENCOUNTER — Ambulatory Visit (INDEPENDENT_AMBULATORY_CARE_PROVIDER_SITE_OTHER): Payer: Medicaid Other | Admitting: Certified Nurse Midwife

## 2017-10-23 VITALS — BP 107/64 | HR 87 | Wt 130.9 lb

## 2017-10-23 DIAGNOSIS — Z3402 Encounter for supervision of normal first pregnancy, second trimester: Secondary | ICD-10-CM

## 2017-10-23 DIAGNOSIS — O99619 Diseases of the digestive system complicating pregnancy, unspecified trimester: Secondary | ICD-10-CM

## 2017-10-23 DIAGNOSIS — O99612 Diseases of the digestive system complicating pregnancy, second trimester: Secondary | ICD-10-CM

## 2017-10-23 DIAGNOSIS — K219 Gastro-esophageal reflux disease without esophagitis: Secondary | ICD-10-CM

## 2017-10-23 MED ORDER — PANTOPRAZOLE SODIUM 40 MG PO TBEC: 40.0000 mg | DELAYED_RELEASE_TABLET | Freq: Two times a day (BID) | ORAL | 2 refills | Status: DC

## 2017-10-23 NOTE — Progress Notes (Signed)
   PRENATAL VISIT NOTE  Subjective:  Kaitlyn Robinson is a 32 y.o. G1P0 at 231w0d being seen today for ongoing prenatal care.  She is currently monitored for the following issues for this low-risk pregnancy and has Supervision of normal first pregnancy; Vaginal discharge during pregnancy; and GERD (gastroesophageal reflux disease) on their problem list.  Patient reports heartburn, no bleeding, no contractions, no cramping, no leaking and upper left quadrant, constipation also reported.  Contractions: Irritability. Vag. Bleeding: None.  Movement: Present. Denies leaking of fluid.   The following portions of the patient's history were reviewed and updated as appropriate: allergies, current medications, past family history, past medical history, past social history, past surgical history and problem list. Problem list updated.  Objective:   Vitals:   10/23/17 1010  BP: 107/64  Pulse: 87  Weight: 130 lb 14.4 oz (59.4 kg)    Fetal Status: Fetal Heart Rate (bpm): 145; doppler Fundal Height: 27 cm Movement: Present     General:  Alert, oriented and cooperative. Patient is in no acute distress.  Skin: Skin is warm and dry. No rash noted.   Cardiovascular: Normal heart rate noted  Respiratory: Normal respiratory effort, no problems with respiration noted  Abdomen: Soft, gravid, appropriate for gestational age.  Pain/Pressure: Absent     Pelvic: Cervical exam deferred        Extremities: Normal range of motion.  Edema: None  Mental Status:  Normal mood and affect. Normal behavior. Normal judgment and thought content.   Assessment and Plan:  Pregnancy: G1P0 at 6431w0d  1. Encounter for supervision of normal first pregnancy in second trimester     2 hour OGTT today.   2. Gastroesophageal reflux in pregnancy     Changed to Protonix.   - pantoprazole (PROTONIX) 40 MG tablet; Take 1 tablet (40 mg total) by mouth 2 (two) times daily before a meal.  Dispense: 60 tablet; Refill: 2  Preterm labor symptoms  and general obstetric precautions including but not limited to vaginal bleeding, contractions, leaking of fluid and fetal movement were reviewed in detail with the patient. Please refer to After Visit Summary for other counseling recommendations.  Return in about 2 weeks (around 11/06/2017) for ROB.   Roe Coombsachelle A Denney, CNM

## 2017-10-23 NOTE — Patient Instructions (Signed)
AREA PEDIATRIC/FAMILY PRACTICE PHYSICIANS  [facility] [address], [address] [place], [place]  [address] Phone - [phone]   Fax - [phone]  [facility] [address] [place], [place] [address] Phone - [phone]   Fax - [phone]  [facility] [address] [place], [place]  [address] Phone - [phone]   Fax - [phone]  [facility] [address] [place], [place]  [address] Phone - [phone]   Fax - [phone]  [facility] [address] [place], [place]  [address] Phone - [phone]   Fax - [phone]  [facility] [address], [address] [place], [place]  [address] Phone - [phone]   Fax - [phone]  [facility] [address] [place], [place]  [address] Phone - [phone]   Fax - [phone]  [facility] [address] [place], [place]  [address] Phone - [phone]   Fax - [phone]  [facility] [address] [place], [place]  [address] Phone - [phone]   Fax - [phone] [facility] [address] [place], [place]  [address] Phone - [phone]   Fax - [phone]  [facility] [address] [place], [place]  [address] Phone - [phone]   Fax - [phone]  [facility] [address] [place], [place]  [address] Phone - [phone]   Fax - [phone]  [facility] [address], [address] [place], [place]  [address] Phone - [phone]   Fax - [phone]  [facility] [address] [place], [place]  [address] Phone - [phone]  [facility] [address] [place], [place]  [address] Phone - [phone]   Fax - [phone]  [facility] [address] [place], [place]  [address] Phone - [phone]   Fax - [phone]  [facility] [address] [place], [place]  [address] Phone - [phone]   Fax - [phone]  [facility] [address] [place], [place]  [address] Phone - [phone]   Fax - [phone]   [facility] [address] [place], [place]  [address] Phone - [phone]   Fax - [phone]  [facility] [address] [place], [place]  [address] Phone - [phone]   Fax - [phone]  [facility] [address] , [place]  [address] Phone - [phone]   Fax - [phone]  [facility] [address] , [place]  [address] Phone - [phone]   Fax - [phone]  [facility] [address] , [place]  [address] Phone - [phone]   Fax - [phone] [facility] [address] [place], [place]  [address] Phone - [phone]   Fax - [phone]  [facility] [address] [place], [place]  [address] Phone - [phone]   Fax - [phone]  [facility] [address] [place], [place]  [address] Phone - [phone]   Fax - [phone]  [facility] Charlene Flemming MD [address] [place] [address] Phone [phone]  Fax [phone]   

## 2017-10-23 NOTE — Progress Notes (Signed)
Complains of left side stomach pain whenever she eats 9/10 x 4 weeks. Meds don't work.  TDAP given in right deltoid., tolerated well. Patient wants to self swab for STD, she is having a discharge.

## 2017-10-24 LAB — CBC
Hematocrit: 35.1 % (ref 34.0–46.6)
Hemoglobin: 11.3 g/dL (ref 11.1–15.9)
MCH: 26.8 pg (ref 26.6–33.0)
MCHC: 32.2 g/dL (ref 31.5–35.7)
MCV: 83 fL (ref 79–97)
PLATELETS: 260 10*3/uL (ref 150–379)
RBC: 4.21 x10E6/uL (ref 3.77–5.28)
RDW: 14.6 % (ref 12.3–15.4)
WBC: 7.6 10*3/uL (ref 3.4–10.8)

## 2017-10-24 LAB — CERVICOVAGINAL ANCILLARY ONLY
Bacterial vaginitis: NEGATIVE
CANDIDA VAGINITIS: NEGATIVE
CHLAMYDIA, DNA PROBE: NEGATIVE
Neisseria Gonorrhea: NEGATIVE
TRICH (WINDOWPATH): NEGATIVE

## 2017-10-24 LAB — GLUCOSE TOLERANCE, 2 HOURS W/ 1HR
GLUCOSE, 1 HOUR: 153 mg/dL (ref 65–179)
GLUCOSE, FASTING: 62 mg/dL — AB (ref 65–91)
Glucose, 2 hour: 120 mg/dL (ref 65–152)

## 2017-10-24 LAB — HIV ANTIBODY (ROUTINE TESTING W REFLEX): HIV SCREEN 4TH GENERATION: NONREACTIVE

## 2017-10-24 LAB — RPR: RPR Ser Ql: NONREACTIVE

## 2017-10-25 ENCOUNTER — Other Ambulatory Visit: Payer: Self-pay | Admitting: Certified Nurse Midwife

## 2017-10-25 DIAGNOSIS — Z3402 Encounter for supervision of normal first pregnancy, second trimester: Secondary | ICD-10-CM

## 2017-11-06 ENCOUNTER — Ambulatory Visit (INDEPENDENT_AMBULATORY_CARE_PROVIDER_SITE_OTHER): Payer: Self-pay | Admitting: Pediatrics

## 2017-11-06 DIAGNOSIS — Z7681 Expectant parent(s) prebirth pediatrician visit: Secondary | ICD-10-CM

## 2017-11-10 NOTE — Progress Notes (Signed)
Prenatal counseling for impending newborn done-- 1st child, currently 29wks, no complications, prenatal 4 wks Z76.81

## 2017-11-13 ENCOUNTER — Other Ambulatory Visit (HOSPITAL_COMMUNITY)
Admission: RE | Admit: 2017-11-13 | Discharge: 2017-11-13 | Disposition: A | Discharge status: Home / Self Care | Payer: Medicaid Other | Source: Ambulatory Visit | Attending: Certified Nurse Midwife | Admitting: Certified Nurse Midwife

## 2017-11-13 ENCOUNTER — Encounter: Payer: Self-pay | Admitting: Certified Nurse Midwife

## 2017-11-13 ENCOUNTER — Ambulatory Visit (INDEPENDENT_AMBULATORY_CARE_PROVIDER_SITE_OTHER): Payer: Medicaid Other | Admitting: Certified Nurse Midwife

## 2017-11-13 VITALS — BP 111/72 | HR 100 | Wt 135.0 lb

## 2017-11-13 DIAGNOSIS — O26899 Other specified pregnancy related conditions, unspecified trimester: Secondary | ICD-10-CM

## 2017-11-13 DIAGNOSIS — R69 Illness, unspecified: Secondary | ICD-10-CM | POA: Diagnosis present

## 2017-11-13 DIAGNOSIS — J111 Influenza due to unidentified influenza virus with other respiratory manifestations: Secondary | ICD-10-CM

## 2017-11-13 DIAGNOSIS — N898 Other specified noninflammatory disorders of vagina: Secondary | ICD-10-CM

## 2017-11-13 DIAGNOSIS — Z3402 Encounter for supervision of normal first pregnancy, second trimester: Secondary | ICD-10-CM

## 2017-11-13 MED ORDER — FLUCONAZOLE 150 MG PO TABS: 150.0000 mg | ORAL_TABLET | Freq: Once | ORAL | 0 refills | Status: DC

## 2017-11-13 MED ORDER — OSELTAMIVIR PHOSPHATE 75 MG PO CAPS
75.0000 mg | ORAL_CAPSULE | Freq: Two times a day (BID) | ORAL | 0 refills | Status: DC
Start: 2017-11-13 — End: 2017-11-27

## 2017-11-13 MED ORDER — TERCONAZOLE 0.8 % VA CREA: 1.0000 | TOPICAL_CREAM | Freq: Every day | VAGINAL | 0 refills | Status: DC

## 2017-11-13 NOTE — Progress Notes (Signed)
Pt states she has a cold.  Wants evaluation for yeast c/o: itching and discharge. Temp:98.7

## 2017-11-13 NOTE — Progress Notes (Signed)
   PRENATAL VISIT NOTE  Subjective:  Kaitlyn Robinson is a 32 y.o. G1P0 at 5052w0d being seen today for ongoing prenatal care.  She is currently monitored for the following issues for this low-risk pregnancy and has Supervision of normal first pregnancy; Vaginal discharge during pregnancy; and GERD (gastroesophageal reflux disease) on their problem list.  Patient reports no bleeding, no leaking, vaginal irritation and fever, cough, muscle aches started 11/12/17 and has taken Robitussin .  Contractions: Not present. Vag. Bleeding: None.  Movement: Present. Denies leaking of fluid.   The following portions of the patient's history were reviewed and updated as appropriate: allergies, current medications, past family history, past medical history, past social history, past surgical history and problem list. Problem list updated.  Objective:   Vitals:   11/13/17 1048  BP: 111/72  Pulse: 100  Weight: 135 lb (61.2 kg)    Fetal Status: Fetal Heart Rate (bpm): 152; doppler Fundal Height: 28 cm Movement: Present     General:  Alert, oriented and cooperative. Patient is in no acute distress.  Skin: Skin is warm and dry. No rash noted.   Cardiovascular: Normal heart rate noted  Respiratory: Normal respiratory effort, no problems with respiration noted  Abdomen: Soft, gravid, appropriate for gestational age.  Pain/Pressure: Absent     Pelvic: Cervical exam deferred        Extremities: Normal range of motion.  Edema: None  Mental Status:  Normal mood and affect. Normal behavior. Normal judgment and thought content.   Assessment and Plan:  Pregnancy: G1P0 at 4952w0d  1. Encounter for supervision of normal first pregnancy in second trimester     ILI today  2. Vaginal discharge during pregnancy, antepartum     Yeast vaginitis - Cervicovaginal ancillary only - fluconazole (DIFLUCAN) 150 MG tablet; Take 1 tablet (150 mg total) by mouth once for 1 dose.  Dispense: 1 tablet; Refill: 0 - terconazole (TERAZOL  3) 0.8 % vaginal cream; Place 1 applicator vaginally at bedtime.  Dispense: 20 g; Refill: 0  3. Influenza-like illness    Out of work this week due to illness discussed, letter written.  - oseltamivir (TAMIFLU) 75 MG capsule; Take 1 capsule (75 mg total) by mouth 2 (two) times daily.  Dispense: 14 capsule; Refill: 0 - Respiratory virus panel  Preterm labor symptoms and general obstetric precautions including but not limited to vaginal bleeding, contractions, leaking of fluid and fetal movement were reviewed in detail with the patient. Please refer to After Visit Summary for other counseling recommendations.  Return in about 2 weeks (around 11/27/2017) for ROB.   Roe Coombsachelle A Denney, CNM

## 2017-11-14 ENCOUNTER — Telehealth: Payer: Self-pay

## 2017-11-14 LAB — CERVICOVAGINAL ANCILLARY ONLY
Bacterial vaginitis: NEGATIVE
CHLAMYDIA, DNA PROBE: NEGATIVE
Candida vaginitis: POSITIVE — AB
Neisseria Gonorrhea: NEGATIVE
Trichomonas: NEGATIVE

## 2017-11-14 NOTE — Telephone Encounter (Signed)
Returned call and pt stated that she has been vomiting all day and has had a fever, but does not have a thermometer to check the temp. Advised pt to take tamiflu that was prescribed and if she cannot keep anything down, then she needs to be evaluated at the hospital, pt agreed

## 2017-11-14 NOTE — Telephone Encounter (Signed)
[place] call, no answer, left vm.

## 2017-11-16 ENCOUNTER — Other Ambulatory Visit: Payer: Self-pay | Admitting: Certified Nurse Midwife

## 2017-11-16 DIAGNOSIS — J101 Influenza due to other identified influenza virus with other respiratory manifestations: Secondary | ICD-10-CM | POA: Insufficient documentation

## 2017-11-16 LAB — RESPIRATORY VIRUS PANEL
ADENOVIRUS: NEGATIVE
INFLUENZA A: POSITIVE — AB
Influenza B: NEGATIVE
Metapneumovirus: NEGATIVE
PARAINFLUENZA 3 A: NEGATIVE
Parainfluenza 1: NEGATIVE
Parainfluenza 2: NEGATIVE
RESPIRATORY SYNCYTIAL VIRUS A: NEGATIVE
Respiratory Syncytial Virus B: NEGATIVE
Rhinovirus: NEGATIVE

## 2017-11-17 ENCOUNTER — Other Ambulatory Visit: Payer: Self-pay

## 2017-11-17 DIAGNOSIS — N898 Other specified noninflammatory disorders of vagina: Secondary | ICD-10-CM

## 2017-11-17 DIAGNOSIS — O26899 Other specified pregnancy related conditions, unspecified trimester: Principal | ICD-10-CM

## 2017-11-17 MED ORDER — FLUCONAZOLE 150 MG PO TABS
150.0000 mg | ORAL_TABLET | Freq: Once | ORAL | 0 refills | Status: AC
Start: 2017-11-17 — End: 2017-11-17

## 2017-11-27 ENCOUNTER — Ambulatory Visit (INDEPENDENT_AMBULATORY_CARE_PROVIDER_SITE_OTHER): Payer: Medicaid Other | Admitting: Certified Nurse Midwife

## 2017-11-27 ENCOUNTER — Encounter: Payer: Self-pay | Admitting: Certified Nurse Midwife

## 2017-11-27 VITALS — BP 114/67 | HR 91 | Wt 135.2 lb

## 2017-11-27 DIAGNOSIS — J101 Influenza due to other identified influenza virus with other respiratory manifestations: Secondary | ICD-10-CM

## 2017-11-27 DIAGNOSIS — Z3403 Encounter for supervision of normal first pregnancy, third trimester: Secondary | ICD-10-CM

## 2017-11-27 NOTE — Progress Notes (Signed)
   PRENATAL VISIT NOTE  Subjective:  Kaitlyn Robinson is a 32 y.o. G1P0 at 7858w0d being seen today for ongoing prenatal care.  She is currently monitored for the following issues for this low-risk pregnancy and has Supervision of normal first pregnancy; Vaginal discharge during pregnancy; GERD (gastroesophageal reflux disease); and Influenza A on their problem list.  Patient reports no bleeding, no cramping, no leaking, occasional contractions and nasal congestion/cough since Influenza A. Discussed OTC medications..  Contractions: Irritability. Vag. Bleeding: None.  Movement: Present. Denies leaking of fluid.   The following portions of the patient's history were reviewed and updated as appropriate: allergies, current medications, past family history, past medical history, past social history, past surgical history and problem list. Problem list updated.  Objective:   Vitals:   11/27/17 1301  BP: 114/67  Pulse: 91  Weight: 135 lb 3.2 oz (61.3 kg)    Fetal Status: Fetal Heart Rate (bpm): 145; doppler Fundal Height: 32 cm Movement: Present     General:  Alert, oriented and cooperative. Patient is in no acute distress.  Skin: Skin is warm and dry. No rash noted.   Cardiovascular: Normal heart rate noted  Respiratory: Normal respiratory effort, no problems with respiration noted  Abdomen: Soft, gravid, appropriate for gestational age.  Pain/Pressure: Absent     Pelvic: Cervical exam deferred        Extremities: Normal range of motion.  Edema: None  Mental Status:  Normal mood and affect. Normal behavior. Normal judgment and thought content.   Assessment and Plan:  Pregnancy: G1P0 at 358w0d  1. Encounter for supervision of normal first pregnancy in third trimester     Doing well.   2. Influenza A     Resolving, completed tamiflu.   Preterm labor symptoms and general obstetric precautions including but not limited to vaginal bleeding, contractions, leaking of fluid and fetal movement were  reviewed in detail with the patient. Please refer to After Visit Summary for other counseling recommendations.  Return in about 2 weeks (around 12/11/2017) for ROB.   Roe Coombsachelle A Tanysha Quant, CNM

## 2017-12-11 ENCOUNTER — Ambulatory Visit (INDEPENDENT_AMBULATORY_CARE_PROVIDER_SITE_OTHER): Payer: Medicaid Other | Admitting: Certified Nurse Midwife

## 2017-12-11 VITALS — BP 117/69 | HR 91 | Wt 137.0 lb

## 2017-12-11 DIAGNOSIS — Z3403 Encounter for supervision of normal first pregnancy, third trimester: Secondary | ICD-10-CM

## 2017-12-11 DIAGNOSIS — N898 Other specified noninflammatory disorders of vagina: Secondary | ICD-10-CM

## 2017-12-11 DIAGNOSIS — O26892 Other specified pregnancy related conditions, second trimester: Secondary | ICD-10-CM

## 2017-12-11 MED ORDER — TERCONAZOLE 0.8 % VA CREA: 1.0000 | TOPICAL_CREAM | Freq: Every day | VAGINAL | 0 refills | Status: DC

## 2017-12-11 MED ORDER — FLUCONAZOLE 150 MG PO TABS: 150.0000 mg | ORAL_TABLET | Freq: Once | ORAL | 0 refills | Status: AC

## 2017-12-11 NOTE — Progress Notes (Signed)
   PRENATAL VISIT NOTE  Subjective:  Livingston Dionesnna Vejar is a 32 y.o. G1P0 at 752w0d being seen today for ongoing prenatal care.  She is currently monitored for the following issues for this low-risk pregnancy and has Supervision of normal first pregnancy; Vaginal discharge during pregnancy; GERD (gastroesophageal reflux disease); and Influenza A on their problem list.  Patient reports no bleeding, no leaking, occasional contractions and vaginal irritation.  Contractions: Irritability. Vag. Bleeding: None.  Movement: Present. Denies leaking of fluid.   The following portions of the patient's history were reviewed and updated as appropriate: allergies, current medications, past family history, past medical history, past social history, past surgical history and problem list. Problem list updated.  Objective:   Vitals:   12/11/17 1318  BP: 117/69  Pulse: 91  Weight: 137 lb (62.1 kg)    Fetal Status: Fetal Heart Rate (bpm): 152; doppler Fundal Height: 34 cm Movement: Present     General:  Alert, oriented and cooperative. Patient is in no acute distress.  Skin: Skin is warm and dry. No rash noted.   Cardiovascular: Normal heart rate noted  Respiratory: Normal respiratory effort, no problems with respiration noted  Abdomen: Soft, gravid, appropriate for gestational age.  Pain/Pressure: Absent     Pelvic: Cervical exam deferred        Extremities: Normal range of motion.  Edema: None  Mental Status:  Normal mood and affect. Normal behavior. Normal judgment and thought content.   Assessment and Plan:  Pregnancy: G1P0 at 362w0d  1. Encounter for supervision of normal first pregnancy in third trimester      2. Vaginal discharge during pregnancy in second trimester     - fluconazole (DIFLUCAN) 150 MG tablet; Take 1 tablet (150 mg total) by mouth once for 1 dose.  Dispense: 1 tablet; Refill: 0 - terconazole (TERAZOL 3) 0.8 % vaginal cream; Place 1 applicator vaginally at bedtime.  Dispense: 20 g;  Refill: 0  Preterm labor symptoms and general obstetric precautions including but not limited to vaginal bleeding, contractions, leaking of fluid and fetal movement were reviewed in detail with the patient. Please refer to After Visit Summary for other counseling recommendations.  Return in about 1 week (around 12/18/2017) for ROB, GBS.   Roe Coombsachelle A Aliannah Holstrom, CNM

## 2017-12-11 NOTE — Progress Notes (Signed)
C/o yeast, itching x 2 days.

## 2017-12-18 ENCOUNTER — Ambulatory Visit (HOSPITAL_COMMUNITY)
Admission: RE | Admit: 2017-12-18 | Discharge: 2017-12-18 | Disposition: A | Discharge status: Home / Self Care | Payer: Medicaid Other | Source: Ambulatory Visit | Attending: Certified Nurse Midwife | Admitting: Certified Nurse Midwife

## 2017-12-18 ENCOUNTER — Other Ambulatory Visit (HOSPITAL_COMMUNITY)
Admission: RE | Admit: 2017-12-18 | Discharge: 2017-12-18 | Disposition: A | Discharge status: Home / Self Care | Payer: Medicaid Other | Source: Ambulatory Visit | Attending: Certified Nurse Midwife | Admitting: Certified Nurse Midwife

## 2017-12-18 ENCOUNTER — Other Ambulatory Visit: Payer: Self-pay | Admitting: Certified Nurse Midwife

## 2017-12-18 ENCOUNTER — Ambulatory Visit (INDEPENDENT_AMBULATORY_CARE_PROVIDER_SITE_OTHER): Payer: Medicaid Other | Admitting: Certified Nurse Midwife

## 2017-12-18 VITALS — BP 127/73 | HR 87 | Wt 140.0 lb

## 2017-12-18 DIAGNOSIS — Z3403 Encounter for supervision of normal first pregnancy, third trimester: Secondary | ICD-10-CM

## 2017-12-18 DIAGNOSIS — Z3A35 35 weeks gestation of pregnancy: Secondary | ICD-10-CM | POA: Diagnosis present

## 2017-12-18 DIAGNOSIS — Z3483 Encounter for supervision of other normal pregnancy, third trimester: Secondary | ICD-10-CM | POA: Diagnosis not present

## 2017-12-18 DIAGNOSIS — Z362 Encounter for other antenatal screening follow-up: Secondary | ICD-10-CM | POA: Diagnosis present

## 2017-12-18 DIAGNOSIS — Z3400 Encounter for supervision of normal first pregnancy, unspecified trimester: Secondary | ICD-10-CM

## 2017-12-18 NOTE — Progress Notes (Signed)
   PRENATAL VISIT NOTE  Subjective:  Livingston Dionesnna Corvera is a 32 y.o. G1P0 at 4126w0d being seen today for ongoing prenatal care.  She is currently monitored for the following issues for this low-risk pregnancy and has Supervision of normal first pregnancy; Vaginal discharge during pregnancy; GERD (gastroesophageal reflux disease); and Influenza A on their problem list.  Patient reports no complaints.  Contractions: Irritability. Vag. Bleeding: None.  Movement: Present. Denies leaking of fluid.   The following portions of the patient's history were reviewed and updated as appropriate: allergies, current medications, past family history, past medical history, past social history, past surgical history and problem list. Problem list updated.  Objective:   Vitals:   12/18/17 1550  BP: 127/73  Pulse: 87  Weight: 140 lb (63.5 kg)    Fetal Status: Fetal Heart Rate (bpm): 146; doppler Fundal Height: 35 cm Movement: Present  Presentation: Vertex  General:  Alert, oriented and cooperative. Patient is in no acute distress.  Skin: Skin is warm and dry. No rash noted.   Cardiovascular: Normal heart rate noted  Respiratory: Normal respiratory effort, no problems with respiration noted  Abdomen: Soft, gravid, appropriate for gestational age.  Pain/Pressure: Absent     Pelvic: Cervical exam performed Dilation: Closed Effacement (%): Thick Station: Ballotable  Extremities: Normal range of motion.  Edema: None  Mental Status:  Normal mood and affect. Normal behavior. Normal judgment and thought content.   Assessment and Plan:  Pregnancy: G1P0 at 4626w0d  1. Encounter for supervision of normal first pregnancy in third trimester     Doing well - Strep Gp B NAA - Cervicovaginal ancillary only  Preterm labor symptoms and general obstetric precautions including but not limited to vaginal bleeding, contractions, leaking of fluid and fetal movement were reviewed in detail with the patient. Please refer to After  Visit Summary for other counseling recommendations.  Return in about 1 week (around 12/25/2017) for ROB.   Roe Coombsachelle A Eveny Anastas, CNM

## 2017-12-20 ENCOUNTER — Other Ambulatory Visit: Payer: Self-pay | Admitting: Certified Nurse Midwife

## 2017-12-20 ENCOUNTER — Encounter: Payer: Self-pay | Admitting: Certified Nurse Midwife

## 2017-12-20 DIAGNOSIS — B951 Streptococcus, group B, as the cause of diseases classified elsewhere: Secondary | ICD-10-CM

## 2017-12-20 DIAGNOSIS — Z3403 Encounter for supervision of normal first pregnancy, third trimester: Secondary | ICD-10-CM

## 2017-12-20 LAB — CERVICOVAGINAL ANCILLARY ONLY
Bacterial vaginitis: NEGATIVE
Candida vaginitis: NEGATIVE
Chlamydia: NEGATIVE
NEISSERIA GONORRHEA: NEGATIVE
Trichomonas: NEGATIVE

## 2017-12-20 LAB — STREP GP B NAA: Strep Gp B NAA: POSITIVE — AB

## 2017-12-21 ENCOUNTER — Telehealth: Payer: Self-pay

## 2017-12-21 ENCOUNTER — Other Ambulatory Visit: Payer: Self-pay | Admitting: Certified Nurse Midwife

## 2017-12-21 DIAGNOSIS — N898 Other specified noninflammatory disorders of vagina: Secondary | ICD-10-CM

## 2017-12-21 MED ORDER — FLUCONAZOLE 150 MG PO TABS: 150.0000 mg | ORAL_TABLET | Freq: Once | ORAL | 0 refills | Status: AC

## 2017-12-21 MED ORDER — TERCONAZOLE 0.8 % VA CREA: 1.0000 | TOPICAL_CREAM | Freq: Every day | VAGINAL | 0 refills | Status: DC

## 2017-12-21 NOTE — Telephone Encounter (Signed)
TC from pt requesting results c/o vaginal itching still

## 2017-12-21 NOTE — Telephone Encounter (Signed)
Please let her know that her swab was negative for yeast candidiasis.  I have however sent in Diflucan/Terazole cream for her to try.  This maybe a slight dermatitis in which she can try OTC hydrocortisone cream for as well.   Thank you Boykin Reaperachelle

## 2017-12-25 ENCOUNTER — Encounter: Payer: Self-pay | Admitting: Advanced Practice Midwife

## 2017-12-25 ENCOUNTER — Ambulatory Visit (INDEPENDENT_AMBULATORY_CARE_PROVIDER_SITE_OTHER): Payer: Medicaid Other | Admitting: Advanced Practice Midwife

## 2017-12-25 VITALS — BP 109/68 | HR 100 | Wt 140.7 lb

## 2017-12-25 DIAGNOSIS — O329XX0 Maternal care for malpresentation of fetus, unspecified, not applicable or unspecified: Secondary | ICD-10-CM

## 2017-12-25 DIAGNOSIS — Z3403 Encounter for supervision of normal first pregnancy, third trimester: Secondary | ICD-10-CM

## 2017-12-25 MED ORDER — COMFORT FIT MATERNITY SUPP MED MISC: 1.0000 | Freq: Every day | 1 refills | Status: DC

## 2017-12-25 NOTE — Patient Instructions (Signed)
Vaginal Delivery Vaginal delivery means that you will give birth by pushing your baby out of your birth canal (vagina). A team of health care providers will help you before, during, and after vaginal delivery. Birth experiences are unique for every woman and every pregnancy, and birth experiences vary depending on where you choose to give birth. What should I do to prepare for my baby's birth? Before your baby is born, it is important to talk with your health care provider about:  Your labor and delivery preferences. These may include: ? Medicines that you may be given. ? How you will manage your pain. This might include non-medical pain relief techniques or injectable pain relief such as epidural analgesia. ? How you and your baby will be monitored during labor and delivery. ? Who may be in the labor and delivery room with you. ? Your feelings about surgical delivery of your baby (cesarean delivery, or C-section) if this becomes necessary. ? Your feelings about receiving donated blood through an IV tube (blood transfusion) if this becomes necessary.  Whether you are able: ? To take pictures or videos of the birth. ? To eat during labor and delivery. ? To move around, walk, or change positions during labor and delivery.  What to expect after your baby is born, such as: ? Whether delayed umbilical cord clamping and cutting is offered. ? Who will care for your baby right after birth. ? Medicines or tests that may be recommended for your baby. ? Whether breastfeeding is supported in your hospital or birth center. ? How long you will be in the hospital or birth center.  How any medical conditions you have may affect your baby or your labor and delivery experience.  To prepare for your baby's birth, you should also:  Attend all of your health care visits before delivery (prenatal visits) as recommended by your health care provider. This is important.  Prepare your home for your baby's  arrival. Make sure that you have: ? Diapers. ? Baby clothing. ? Feeding equipment. ? Safe sleeping arrangements for you and your baby.  Install a car seat in your vehicle. Have your car seat checked by a certified car seat installer to make sure that it is installed safely.  Think about who will help you with your new baby at home for at least the first several weeks after delivery.  What can I expect when I arrive at the birth center or hospital? Once you are in labor and have been admitted into the hospital or birth center, your health care provider may:  Review your pregnancy history and any concerns you have.  Insert an IV tube into one of your veins. This is used to give you fluids and medicines.  Check your blood pressure, pulse, temperature, and heart rate (vital signs).  Check whether your bag of water (amniotic sac) has broken (ruptured).  Talk with you about your birth plan and discuss pain control options.  Monitoring Your health care provider may monitor your contractions (uterine monitoring) and your baby's heart rate (fetal monitoring). You may need to be monitored:  Often, but not continuously (intermittently).  All the time or for long periods at a time (continuously). Continuous monitoring may be needed if: ? You are taking certain medicines, such as medicine to relieve pain or make your contractions stronger. ? You have pregnancy or labor complications.  Monitoring may be done by:  Placing a special stethoscope or a handheld monitoring device on your abdomen to   check your baby's heartbeat, and feeling your abdomen for contractions. This method of monitoring does not continuously record your baby's heartbeat or your contractions.  Placing monitors on your abdomen (external monitors) to record your baby's heartbeat and the frequency and length of contractions. You may not have to wear external monitors all the time.  Placing monitors inside of your uterus  (internal monitors) to record your baby's heartbeat and the frequency, length, and strength of your contractions. ? Your health care provider may use internal monitors if he or she needs more information about the strength of your contractions or your baby's heart rate. ? Internal monitors are put in place by passing a thin, flexible wire through your vagina and into your uterus. Depending on the type of monitor, it may remain in your uterus or on your baby's head until birth. ? Your health care provider will discuss the benefits and risks of internal monitoring with you and will ask for your permission before inserting the monitors.  Telemetry. This is a type of continuous monitoring that can be done with external or internal monitors. Instead of having to stay in bed, you are able to move around during telemetry. Ask your health care provider if telemetry is an option for you.  Physical exam Your health care provider may perform a physical exam. This may include:  Checking whether your baby is positioned: ? With the head toward your vagina (head-down). This is most common. ? With the head toward the top of your uterus (head-up or breech). If your baby is in a breech position, your health care provider may try to turn your baby to a head-down position so you can deliver vaginally. If it does not seem that your baby can be born vaginally, your provider may recommend surgery to deliver your baby. In rare cases, you may be able to deliver vaginally if your baby is head-up (breech delivery). ? Lying sideways (transverse). Babies that are lying sideways cannot be delivered vaginally.  Checking your cervix to determine: ? Whether it is thinning out (effacing). ? Whether it is opening up (dilating). ? How low your baby has moved into your birth canal.  What are the three stages of labor and delivery?  Normal labor and delivery is divided into the following three stages: Stage 1  Stage 1 is the  longest stage of labor, and it can last for hours or days. Stage 1 includes: ? Early labor. This is when contractions may be irregular, or regular and mild. Generally, early labor contractions are more than 10 minutes apart. ? Active labor. This is when contractions get longer, more regular, more frequent, and more intense. ? The transition phase. This is when contractions happen very close together, are very intense, and may last longer than during any other part of labor.  Contractions generally feel mild, infrequent, and irregular at first. They get stronger, more frequent (about every 2-3 minutes), and more regular as you progress from early labor through active labor and transition.  Many women progress through stage 1 naturally, but you may need help to continue making progress. If this happens, your health care provider may talk with you about: ? Rupturing your amniotic sac if it has not ruptured yet. ? Giving you medicine to help make your contractions stronger and more frequent.  Stage 1 ends when your cervix is completely dilated to 4 inches (10 cm) and completely effaced. This happens at the end of the transition phase. Stage 2  Once   your cervix is completely effaced and dilated to 4 inches (10 cm), you may start to feel an urge to push. It is common for the body to naturally take a rest before feeling the urge to push, especially if you received an epidural or certain other pain medicines. This rest period may last for up to 1-2 hours, depending on your unique labor experience.  During stage 2, contractions are generally less painful, because pushing helps relieve contraction pain. Instead of contraction pain, you may feel stretching and burning pain, especially when the widest part of your baby's head passes through the vaginal opening (crowning).  Your health care provider will closely monitor your pushing progress and your baby's progress through the vagina during stage 2.  Your  health care provider may massage the area of skin between your vaginal opening and anus (perineum) or apply warm compresses to your perineum. This helps it stretch as the baby's head starts to crown, which can help prevent perineal tearing. ? In some cases, an incision may be made in your perineum (episiotomy) to allow the baby to pass through the vaginal opening. An episiotomy helps to make the opening of the vagina larger to allow more room for the baby to fit through.  It is very important to breathe and focus so your health care provider can control the delivery of your baby's head. Your health care provider may have you decrease the intensity of your pushing, to help prevent perineal tearing.  After delivery of your baby's head, the shoulders and the rest of the body generally deliver very quickly and without difficulty.  Once your baby is delivered, the umbilical cord may be cut right away, or this may be delayed for 1-2 minutes, depending on your baby's health. This may vary among health care providers, hospitals, and birth centers.  If you and your baby are healthy enough, your baby may be placed on your chest or abdomen to help maintain the baby's temperature and to help you bond with each other. Some mothers and babies start breastfeeding at this time. Your health care team will dry your baby and help keep your baby warm during this time.  Your baby may need immediate care if he or she: ? Showed signs of distress during labor. ? Has a medical condition. ? Was born too early (prematurely). ? Had a bowel movement before birth (meconium). ? Shows signs of difficulty transitioning from being inside the uterus to being outside of the uterus. If you are planning to breastfeed, your health care team will help you begin a feeding. Stage 3  The third stage of labor starts immediately after the birth of your baby and ends after you deliver the placenta. The placenta is an organ that develops  during pregnancy to provide oxygen and nutrients to your baby in the womb.  Delivering the placenta may require some pushing, and you may have mild contractions. Breastfeeding can stimulate contractions to help you deliver the placenta.  After the placenta is delivered, your uterus should tighten (contract) and become firm. This helps to stop bleeding in your uterus. To help your uterus contract and to control bleeding, your health care provider may: ? Give you medicine by injection, through an IV tube, by mouth, or through your rectum (rectally). ? Massage your abdomen or perform a vaginal exam to remove any blood clots that are left in your uterus. ? Empty your bladder by placing a thin, flexible tube (catheter) into your bladder. ? Encourage   you to breastfeed your baby. After labor is over, you and your baby will be monitored closely to ensure that you are both healthy until you are ready to go home. Your health care team will teach you how to care for yourself and your baby. This information is not intended to replace advice given to you by your health care provider. Make sure you discuss any questions you have with your health care provider. Document Released: 06/28/2008 Document Revised: 04/08/2016 Document Reviewed: 10/04/2015 Elsevier Interactive Patient Education  2018 [employer]  

## 2017-12-25 NOTE — Progress Notes (Signed)
   PRENATAL VISIT NOTE  Subjective:  Kaitlyn Robinson is a 32 y.o. G1P0 at 3658w0d being seen today for ongoing prenatal care.  She is currently monitored for the following issues for this low-risk pregnancy and has Supervision of normal first pregnancy; Vaginal discharge during pregnancy; GERD (gastroesophageal reflux disease); Influenza A; Positive GBS test; and Malpresentation of fetus, antepartum on their problem list.  Patient reports pelvic pressure.  Contractions: Irritability. Vag. Bleeding: None.  Movement: Present. Denies leaking of fluid.   The following portions of the patient's history were reviewed and updated as appropriate: allergies, current medications, past family history, past medical history, past social history, past surgical history and problem list. Problem list updated.  Objective:   Vitals:   12/25/17 1059  BP: 109/68  Pulse: 100  Weight: 140 lb 11.2 oz (63.8 kg)    Fetal Status:     Movement: Present     General:  Alert, oriented and cooperative. Patient is in no acute distress.  Skin: Skin is warm and dry. No rash noted.   Cardiovascular: Normal heart rate noted  Respiratory: Normal respiratory effort, no problems with respiration noted  Abdomen: Soft, gravid, appropriate for gestational age.  Pain/Pressure: Present     Pelvic: Cervical exam deferred        Extremities: Normal range of motion.  Edema: None  Mental Status:  Normal mood and affect. Normal behavior. Normal judgment and thought content.   Assessment and Plan:  Pregnancy: G1P0 at 3958w0d  1. Encounter for supervision of normal first pregnancy in third trimester     States wants waterbirth.       Class certificate is under Media tab      Cannot find consent, none in office, so will have her sign one next week  2. Malpresentation of fetus, antepartum      Noticed this finding on US after patient left      I tried to call her but could not get call to go through      Message sent to pool to have her  US repeated and schedule her for Version   Term labor symptoms and general obstetric precautions including but not limited to vaginal bleeding, contractions, leaking of fluid and fetal movement were reviewed in detail with the patient. Please refer to After Visit Summary for other counseling recommendations.  Return in about 1 week (around 01/01/2018) for femina.   Wynelle BourgeoisMarie Micki Cassel, CNM

## 2017-12-25 NOTE — Progress Notes (Signed)
C/o pressure, wants Maternity Belt.

## 2017-12-27 ENCOUNTER — Ambulatory Visit (INDEPENDENT_AMBULATORY_CARE_PROVIDER_SITE_OTHER): Payer: Medicaid Other | Admitting: Obstetrics and Gynecology

## 2017-12-27 ENCOUNTER — Other Ambulatory Visit: Payer: Self-pay

## 2017-12-27 ENCOUNTER — Encounter: Payer: Self-pay | Admitting: Obstetrics and Gynecology

## 2017-12-27 ENCOUNTER — Other Ambulatory Visit: Payer: Self-pay | Admitting: Obstetrics & Gynecology

## 2017-12-27 ENCOUNTER — Ambulatory Visit: Payer: Medicaid Other

## 2017-12-27 DIAGNOSIS — O329XX0 Maternal care for malpresentation of fetus, unspecified, not applicable or unspecified: Secondary | ICD-10-CM

## 2017-12-27 DIAGNOSIS — Z3403 Encounter for supervision of normal first pregnancy, third trimester: Secondary | ICD-10-CM

## 2017-12-27 NOTE — Patient Instructions (Signed)
Breech Birth What is a breech birth? A breech birth is when a baby is born with the buttocks or the feet first. Most babies are in a head down (vertex) position when they are born. There are three types of breech babies:  When the baby's buttocks are showing first in the birth canal (vagina) with the legs straight up and the feet at the baby's head (frank breech).  When the baby's buttocks shows first with the legs bent at the knees and the feet down near the buttocks (complete breech).  When one or both of the baby's feet are down below the buttocks (footling breech).  What are the risks of a breech birth? Having a breech birth increases the risk to your baby. A breech birth may cause the following:  Umbilical cord prolapse. This is when the umbilical cord is in front of the baby before or during labor. This can cause the cord to become pinched or compressed. This can reduce the flow of blood and oxygen to the baby.  The baby getting stuck in the birth canal, which can cause injury or, rarely, death.  Injury to the nerves in the shoulder, arm, and hand (brachial plexus injury) when delivered.  Your baby being born too early (prematurely).  An increased need for a cesarean delivery.  What increases the risk of having a breech baby? It is not known what causes your baby to be breech. However, risk factors that may increase your chances of having a breech baby include the following:  The mother having had several babies already.  The mother having twins or more.  The mother having a baby with certain congenital disabilities.  The mother going into labor early.  The mother having problems with her uterus, such as a tumor.  The mother having placenta problems (placenta previa) or too much or not enough fluid surrounding the baby (amniotic fluid).  How do I know if my baby is breech? There are no symptoms for you to know that your baby is breech. When you are close to your due date,  your health care provider can tell if your baby is breech by:  An abdominal or vaginal (pelvic) exam.  An ultrasound.  Your health care provider may also be able to tell that your baby is breech if your baby's heartbeat is heard above your belly button. What can be done if my baby is breech?  Your health care provider may try to turn the baby in your uterus. This is a procedure called external cephalic version (ECV). This is done by your health care provider. He or she will place both hands on your abdomen and gently and slowly turn the baby around. It is important to know that ECV can increase your chances of suddenly going into labor. If an ECV is done, it is done toward the end of a healthy pregnancy. The baby may remain in this position or he or she may turn back to the breech position. You and your health care provider will discuss if an ECV is recommended for you and your baby. How will I delivery my baby if my baby is breech? You and your health care provider will discuss the best way to deliver your baby. If your baby is breech, it is less likely that a vaginal delivery will be recommended due to the risks. Some breech babies may be delivered safely without a cesarean, while in other cases health care providers will recommend a cesarean delivery. This   information is not intended to replace advice given to you by your health care provider. Make sure you discuss any questions you have with your health care provider. Document Released: 11/10/2006 Document Revised: 09/05/2016 Document Reviewed: 07/24/2014 Elsevier Interactive Patient Education  2017 Elsevier Inc.  

## 2017-12-27 NOTE — Progress Notes (Signed)
Pt seen today for U/S d/t possible malpresentation. U/S confirmed transverse, back up, vertex on right  U/S findings reviewed with pt ECV reviewed with pt and FOB R/B discussed  Pt scheduled for ECV tomorrow F/U OB appt in 1 week

## 2017-12-28 ENCOUNTER — Encounter (HOSPITAL_COMMUNITY): Payer: Self-pay

## 2017-12-28 ENCOUNTER — Other Ambulatory Visit: Payer: Self-pay | Admitting: Obstetrics and Gynecology

## 2017-12-28 ENCOUNTER — Inpatient Hospital Stay (HOSPITAL_COMMUNITY)
Admission: RE | Admit: 2017-12-28 | Discharge: 2017-12-28 | DRG: 833 | Disposition: A | Discharge status: Home / Self Care | Payer: Medicaid Other | Source: Ambulatory Visit | Attending: Obstetrics & Gynecology | Admitting: Obstetrics & Gynecology

## 2017-12-28 DIAGNOSIS — O321XX0 Maternal care for breech presentation, not applicable or unspecified: Principal | ICD-10-CM | POA: Diagnosis present

## 2017-12-28 DIAGNOSIS — Z3A36 36 weeks gestation of pregnancy: Secondary | ICD-10-CM

## 2017-12-28 DIAGNOSIS — O99820 Streptococcus B carrier state complicating pregnancy: Secondary | ICD-10-CM | POA: Diagnosis present

## 2017-12-28 HISTORY — DX: Other specified health status: Z78.9

## 2017-12-28 LAB — CBC
HCT: 33.2 % — ABNORMAL LOW (ref 36.0–46.0)
Hemoglobin: 10.9 g/dL — ABNORMAL LOW (ref 12.0–15.0)
MCH: 26.8 pg (ref 26.0–34.0)
MCHC: 32.8 g/dL (ref 30.0–36.0)
MCV: 81.6 fL (ref 78.0–100.0)
PLATELETS: 208 10*3/uL (ref 150–400)
RBC: 4.07 MIL/uL (ref 3.87–5.11)
RDW: 14 % (ref 11.5–15.5)
WBC: 6.5 10*3/uL (ref 4.0–10.5)

## 2017-12-28 LAB — RPR: RPR: NONREACTIVE

## 2017-12-28 MED ORDER — TERBUTALINE SULFATE 1 MG/ML IJ SOLN
0.2500 mg | Freq: Once | INTRAMUSCULAR | Status: AC
  Administered 2017-12-28: 0.25 mg via SUBCUTANEOUS
  Filled 2017-12-28: qty 1

## 2017-12-28 MED ORDER — ONDANSETRON HCL 4 MG/2ML IJ SOLN: 4.0000 mg | Freq: Four times a day (QID) | INTRAMUSCULAR | Status: DC | PRN

## 2017-12-28 MED ORDER — HYDROXYZINE HCL 50 MG PO TABS: 50.0000 mg | ORAL_TABLET | Freq: Four times a day (QID) | ORAL | Status: DC | PRN

## 2017-12-28 MED ORDER — FENTANYL CITRATE (PF) 100 MCG/2ML IJ SOLN
50.0000 ug | INTRAMUSCULAR | Status: DC | PRN
  Filled 2017-12-28: qty 2

## 2017-12-28 MED ORDER — LACTATED RINGERS IV SOLN: 500.0000 mL | INTRAVENOUS | Status: DC | PRN

## 2017-12-28 MED ORDER — LACTATED RINGERS IV SOLN
INTRAVENOUS | Status: DC
  Administered 2017-12-28: 09:00:00 via INTRAVENOUS

## 2017-12-28 NOTE — Discharge Instructions (Signed)

## 2017-12-28 NOTE — H&P (Signed)
Livingston Dionesnna Stroble is a 32 y.o. female presenting for external cephalic version, 6913w3d G1P0 Breech by US yesterday and today. She has been counseled about ECV and the risk of failure, pain, emergency cesarean section, ROM, bleeding and her questions were answered.. OB History    Gravida  1   Para      Term      Preterm      AB      Living        SAB      TAB      Ectopic      Multiple      Live Births             Past Medical History:  Diagnosis Date  . Medical history non-contributory    History reviewed. No pertinent surgical history. Family History: family history includes Colon cancer in her mother; Gout in her father; Seizures in her brother. Social History:  reports that she has never smoked. She has never used smokeless tobacco. She reports that she does not drink alcohol or use drugs.     Maternal Diabetes: No Genetic Screening: Normal Maternal Ultrasounds/Referrals: Normal Fetal Ultrasounds or other Referrals:  None Maternal Substance Abuse:  No Significant Maternal Medications:  None Significant Maternal Lab Results:  None Other Comments:  None  ROS Maternal Medical History:  Contractions: Frequency: rare.    Fetal activity: Perceived fetal activity is normal.    Prenatal complications: no prenatal complications Prenatal Complications - Diabetes: none.      Temperature 98.2 F (36.8 C), temperature source Oral, height 5' (1.524 m), weight 64 kg (141 lb 0.6 oz), last menstrual period 04/17/2017. Maternal Exam:  Uterine Assessment: Contraction strength is mild.  Contraction frequency is rare.   Abdomen: Patient reports no abdominal tenderness. Fetal presentation: breech  Introitus: not evaluated.     Physical Exam  Vitals reviewed. Constitutional: She is oriented to person, place, and time. She appears well-developed. No distress.  Neck: Normal range of motion.  Respiratory: Effort normal. No respiratory distress.  GI: Soft.  Neurological:  She is alert and oriented to person, place, and time.  Skin: Skin is warm and dry.  Psychiatric: She has a normal mood and affect. Her behavior is normal.    Prenatal labs: ABO, Rh: O/Positive/-- (10/01 1100) Antibody: Negative (10/01 1100) Rubella: 1.30 (10/01 1100) RPR: Non Reactive (01/21 1258)  HBsAg: Negative (10/01 1100)  HIV: Non Reactive (01/21 1258)  GBS: Positive (03/18 1703)   Assessment/Plan: Breech presentation, consented for ECV in L&D   Scheryl DarterJames Aaidyn San 12/28/2017, 9:02 AM

## 2017-12-28 NOTE — Anesthesia Pain Management Evaluation Note (Signed)
  CRNA Pain Management Visit Note  Patient: Kaitlyn Robinson, 32 y.o., female  "Hello I am a member of the anesthesia team at [facility]. We have an anesthesia team available at all times to provide care throughout the hospital, including epidural management and anesthesia for C-section. I don't know your plan for the delivery whether it a natural birth, water birth, IV sedation, nitrous supplementation, doula or epidural, but we want to meet your pain goals."   1.Was your pain managed to your expectations on prior hospitalizations?   No prior hospitalizations  2.What is your expectation for pain management during this hospitalization?     Labor support without medications and Epidural  3.How can we help you reach that goal? Possible epidural  Record the patient's initial score and the patient's pain goal.   Pain: 0  Pain Goal: 10 [facility] wants you to be able to say your pain was always managed very well.  Kaitlyn Robinson 12/28/2017

## 2017-12-28 NOTE — Progress Notes (Signed)
Second RN who reviewed Suezanne Cheshirestrip-Shannon Earl, RN

## 2017-12-28 NOTE — Discharge Summary (Signed)
OB Discharge Summary     Patient Name: Kaitlyn Robinson DOB: 04/12/1986 MRN: [id number]  Date of admission: 12/28/2017 Delivering MD: This patient has no babies on file.  Date of discharge: 12/28/2017  Admitting diagnosis: VERSION Intrauterine pregnancy: 4137w3d     Secondary diagnosis:  Active Problems:   Breech presentation of fetus  Additional problems: Unsuccessful ECV     Discharge diagnosis: breech presentation                                                                                                Post partum procedures:none  Augmentation: n/a  Complications: None  Hospital course:  Patient presented for ECV 6837w3d ECV with [place] guidance was unsuccessful, anticipate primary CS at 39 weeks  Physical exam  Vitals:   12/28/17 0810 12/28/17 0833  Temp:  98.2 F (36.8 C)  TempSrc:  Oral  Weight: 64 kg (141 lb 0.6 oz)   Height: 5' (1.524 m)    General: alert, cooperative and no distress Uterine Fundus: gravid Incision: N/A DVT Evaluation: No evidence of DVT seen on physical exam. Labs: Lab Results  Component Value Date   WBC 6.5 12/28/2017   HGB 10.9 (L) 12/28/2017   HCT 33.2 (L) 12/28/2017   MCV 81.6 12/28/2017   PLT 208 12/28/2017   CMP Latest Ref Rng & Units 09/29/2010  Glucose 70 - 99 mg/dL 94  BUN 6 - 23 mg/dL 6  Creatinine 0.4 - 1.2 mg/dL 4.090.69  Sodium 811135 - 914145 mEq/L 138  Potassium 3.5 - 5.1 mEq/L 4.1  Chloride 96 - 112 mEq/L 107  CO2 19 - 32 mEq/L 24  Calcium 8.4 - 10.5 mg/dL 8.9  Total Protein 6.0 - 8.3 g/dL -  Total Bilirubin 0.3 - 1.2 mg/dL -  Alkaline Phos 39 - 782117 U/L -  AST 0 - 37 U/L -  ALT 0 - 35 U/L -    Discharge instruction: per After Visit Summary   After visit meds:   Allergies as of 12/28/2017   No Known Allergies     Medication List    TAKE these medications   COMFORT FIT MATERNITY SUPP MED Misc 1 Device by Does not apply route daily.   fluconazole 150 MG tablet Commonly known as:  DIFLUCAN TK 1 T PO FOR ONE DOSE    pantoprazole 40 MG tablet Commonly known as:  PROTONIX Take 1 tablet (40 mg total) by mouth 2 (two) times daily before a meal.   ranitidine 150 MG tablet Commonly known as:  ZANTAC Take 1 tablet (150 mg total) by mouth 2 (two) times daily.   terconazole 0.8 % vaginal cream Commonly known as:  TERAZOL 3 Place 1 applicator vaginally at bedtime.   VITAFOL GUMMIES 3.33-0.333-34.8 MG Chew Chew 75 mg by mouth every morning.        Diet: routine diet  Activity: [facility] as tolerated. [facility]  Outpatient follow up:4 days Follow up Appt: Future Appointments  Date Time Provider Department Center  01/01/2018  1:30 PM Orvilla Cornwallenney, Rachelle A, CNM [facility] None   Follow up Visit:01/01/18  12/28/2017 Scheryl Darter, MD

## 2017-12-28 NOTE — Op Note (Signed)
External Cephalic Version  Preprocedure Diagnosis:  32 y.o. G1P0 at 9314w3d weeks gestational age with breech presentation  Post-procedure Diagnosis: 32 y.o. G1P0 at 3814w3d weeks gestational age with breech presentation  Procedure:  unsuccessful version  Procedure in detail:   The patient was brought to Labor and Delivery where a reactive fetal heart tracing was obtained. The patient was noted to have irregular contractions. She was given 1 dose of subcutaneous terbutaline which resolved her contraction. A bedside ultrasound was performed which revealed single intrauterine pregnancy and breech presentation. There was noted to be adequate fluid. Using manual pressure, the breech was manipulated in a forward roll fashion but a vertex presentation was not obtained. Fetal heart tones were checked intermittently during the procedure and were noted to be reassuring. Following unsuccessful external cephalic version, the patient was placed on continuous external fetal monitoring. She was noted to have a reassuring and reactive tracing for 1/2 hour following the external cephalic version. She did not have regular contractions and therefore she was felt to be stable for discharge to home. She was given appropriate labor instructions.    Adam PhenixArnold, Marili Vader G, MD 12/28/2017 9:38 AM

## 2018-01-01 ENCOUNTER — Ambulatory Visit (INDEPENDENT_AMBULATORY_CARE_PROVIDER_SITE_OTHER): Payer: Medicaid Other | Admitting: Certified Nurse Midwife

## 2018-01-01 ENCOUNTER — Encounter: Payer: Self-pay | Admitting: Certified Nurse Midwife

## 2018-01-01 VITALS — BP 132/80 | HR 102 | Wt 143.8 lb

## 2018-01-01 DIAGNOSIS — B951 Streptococcus, group B, as the cause of diseases classified elsewhere: Secondary | ICD-10-CM

## 2018-01-01 DIAGNOSIS — O368130 Decreased fetal movements, third trimester, not applicable or unspecified: Secondary | ICD-10-CM

## 2018-01-01 DIAGNOSIS — O329XX0 Maternal care for malpresentation of fetus, unspecified, not applicable or unspecified: Secondary | ICD-10-CM

## 2018-01-01 DIAGNOSIS — Z3403 Encounter for supervision of normal first pregnancy, third trimester: Secondary | ICD-10-CM

## 2018-01-01 NOTE — Progress Notes (Addendum)
   PRENATAL VISIT NOTE  Subjective:  Kaitlyn Robinson is a 32 y.o. G1P0 at 5088w0d being seen today for ongoing prenatal care.  She is currently monitored for the following issues for this low-risk pregnancy and has Supervision of normal first pregnancy; GERD (gastroesophageal reflux disease); Influenza A; Positive GBS test; Malpresentation of fetus, antepartum; and Breech presentation of fetus on their problem list.  Patient reports no complaints.  Contractions: Irregular. Vag. Bleeding: None.  Movement: (!) Decreased. Denies leaking of fluid.   The following portions of the patient's history were reviewed and updated as appropriate: allergies, current medications, past family history, past medical history, past social history, past surgical history and problem list. Problem list updated.  Objective:   Vitals:   01/01/18 1340  BP: 132/80  Pulse: (!) 102  Weight: 143 lb 12.8 oz (65.2 kg)    Fetal Status: Fetal Heart Rate (bpm): NST; reactive Fundal Height: 37 cm Movement: (!) Decreased     General:  Alert, oriented and cooperative. Patient is in no acute distress.  Skin: Skin is warm and dry. No rash noted.   Cardiovascular: Normal heart rate noted  Respiratory: Normal respiratory effort, no problems with respiration noted  Abdomen: Soft, gravid, appropriate for gestational age.  Pain/Pressure: Present     Pelvic: Cervical exam deferred        Extremities: Normal range of motion.  Edema: Trace  Mental Status: Normal mood and affect. Normal behavior. Normal judgment and thought content.  NST: + accels, no decels, moderate variability, Cat. 1 tracing. Baseline: 140.  No contractions on toco.   Assessment and Plan:  Pregnancy: G1P0 at 3388w0d  1. Decreased fetal movements in third trimester, single or unspecified fetus    Reactive NST - Fetal nonstress test; Future  2. Encounter for supervision of normal first pregnancy in third trimester     Doing well  3. Positive GBS test     PCN for  labor/delivery if vertex  4. Malpresentation of fetus, antepartum     Breech on exam and US: failed version 12/28/17.  Has C-section scheduled with Dr. Jolayne Pantheronstant on  01/16/18.     Term labor symptoms and general obstetric precautions including but not limited to vaginal bleeding, contractions, leaking of fluid and fetal movement were reviewed in detail with the patient. Please refer to After Visit Summary for other counseling recommendations.  Return in about 1 week (around 01/08/2018) for ROB.  Future Appointments  Date Time Provider Department Center  01/08/2018  2:00 PM Denney, Rodell Pernaachelle A, CNM [facility] None    Roe Coombsachelle A Denney, CNM

## 2018-01-01 NOTE — Progress Notes (Signed)
Patient reports decreased movement since version procedure last week.

## 2018-01-04 ENCOUNTER — Encounter (HOSPITAL_COMMUNITY): Payer: Self-pay | Admitting: Obstetrics and Gynecology

## 2018-01-04 ENCOUNTER — Encounter: Payer: Self-pay | Admitting: Certified Nurse Midwife

## 2018-01-08 ENCOUNTER — Telehealth (HOSPITAL_COMMUNITY): Payer: Self-pay | Admitting: *Deleted

## 2018-01-08 ENCOUNTER — Inpatient Hospital Stay (EMERGENCY_DEPARTMENT_HOSPITAL)
Admission: AD | Admit: 2018-01-08 | Discharge: 2018-01-08 | Disposition: A | Discharge status: Home / Self Care | Payer: Medicaid Other | Source: Ambulatory Visit | Attending: Obstetrics and Gynecology | Admitting: Obstetrics and Gynecology

## 2018-01-08 ENCOUNTER — Ambulatory Visit (INDEPENDENT_AMBULATORY_CARE_PROVIDER_SITE_OTHER): Payer: Medicaid Other | Admitting: Certified Nurse Midwife

## 2018-01-08 ENCOUNTER — Other Ambulatory Visit (HOSPITAL_COMMUNITY): Admission: RE | Admit: 2018-01-08 | Payer: Medicaid Other | Source: Ambulatory Visit

## 2018-01-08 ENCOUNTER — Encounter (HOSPITAL_COMMUNITY): Payer: Self-pay | Admitting: *Deleted

## 2018-01-08 ENCOUNTER — Other Ambulatory Visit (HOSPITAL_COMMUNITY)
Admission: RE | Admit: 2018-01-08 | Discharge: 2018-01-08 | Disposition: A | Discharge status: Home / Self Care | Payer: Medicaid Other | Source: Ambulatory Visit | Attending: Certified Nurse Midwife | Admitting: Certified Nurse Midwife

## 2018-01-08 ENCOUNTER — Other Ambulatory Visit: Payer: Self-pay

## 2018-01-08 ENCOUNTER — Encounter: Payer: Self-pay | Admitting: Certified Nurse Midwife

## 2018-01-08 VITALS — BP 125/81 | HR 99 | Wt 144.0 lb

## 2018-01-08 DIAGNOSIS — Z3403 Encounter for supervision of normal first pregnancy, third trimester: Secondary | ICD-10-CM | POA: Insufficient documentation

## 2018-01-08 DIAGNOSIS — Z3A38 38 weeks gestation of pregnancy: Secondary | ICD-10-CM

## 2018-01-08 DIAGNOSIS — O471 False labor at or after 37 completed weeks of gestation: Secondary | ICD-10-CM

## 2018-01-08 DIAGNOSIS — O26853 Spotting complicating pregnancy, third trimester: Secondary | ICD-10-CM | POA: Insufficient documentation

## 2018-01-08 DIAGNOSIS — B373 Candidiasis of vulva and vagina: Secondary | ICD-10-CM

## 2018-01-08 DIAGNOSIS — N898 Other specified noninflammatory disorders of vagina: Secondary | ICD-10-CM | POA: Diagnosis not present

## 2018-01-08 DIAGNOSIS — O321XX0 Maternal care for breech presentation, not applicable or unspecified: Secondary | ICD-10-CM

## 2018-01-08 DIAGNOSIS — B951 Streptococcus, group B, as the cause of diseases classified elsewhere: Secondary | ICD-10-CM

## 2018-01-08 DIAGNOSIS — B3731 Acute candidiasis of vulva and vagina: Secondary | ICD-10-CM

## 2018-01-08 DIAGNOSIS — O479 False labor, unspecified: Secondary | ICD-10-CM

## 2018-01-08 MED ORDER — FLUCONAZOLE 150 MG PO TABS: ORAL_TABLET | ORAL | 0 refills | Status: DC

## 2018-01-08 MED ORDER — TERCONAZOLE 0.8 % VA CREA: 1.0000 | TOPICAL_CREAM | Freq: Every day | VAGINAL | 0 refills | Status: DC

## 2018-01-08 NOTE — Addendum Note (Signed)
Addended by: Marya LandryFOSTER, Hiep Ollis D on: 01/08/2018 02:19 PM   Modules accepted: Orders

## 2018-01-08 NOTE — Telephone Encounter (Signed)
Preadmission screen  

## 2018-01-08 NOTE — Discharge Instructions (Signed)
Braxton Hicks Contractions °Contractions of the uterus can occur throughout pregnancy, but they are not always a sign that you are in labor. You may have practice contractions called Braxton Hicks contractions. These false labor contractions are sometimes confused with true labor. °What are Braxton Hicks contractions? °Braxton Hicks contractions are tightening movements that occur in the muscles of the uterus before labor. Unlike true labor contractions, these contractions do not result in opening (dilation) and thinning of the cervix. Toward the end of pregnancy (32-34 weeks), Braxton Hicks contractions can happen more often and may become stronger. These contractions are sometimes difficult to tell apart from true labor because they can be very uncomfortable. You should not feel embarrassed if you go to the hospital with false labor. °Sometimes, the only way to tell if you are in true labor is for your health care provider to look for changes in the cervix. The health care provider will do a physical exam and may monitor your contractions. If you are not in true labor, the exam should show that your cervix is not dilating and your water has not broken. °If there are other health problems associated with your pregnancy, it is completely safe for you to be sent home with false labor. You may continue to have Braxton Hicks contractions until you go into true labor. °How to tell the difference between true labor and false labor °True labor °· Contractions last 30-70 seconds. °· Contractions become very regular. °· Discomfort is usually felt in the top of the uterus, and it spreads to the lower abdomen and low back. °· Contractions do not go away with walking. °· Contractions usually become more intense and increase in frequency. °· The cervix dilates and gets thinner. °False labor °· Contractions are usually shorter and not as strong as true labor contractions. °· Contractions are usually irregular. °· Contractions  are often felt in the front of the lower abdomen and in the groin. °· Contractions may go away when you walk around or change positions while lying down. °· Contractions get weaker and are shorter-lasting as time goes on. °· The cervix usually does not dilate or become thin. °Follow these instructions at home: °· Take over-the-counter and prescription medicines only as told by your health care provider. °· Keep up with your usual exercises and follow other instructions from your health care provider. °· Eat and drink lightly if you think you are going into labor. °· If Braxton Hicks contractions are making you uncomfortable: °? Change your position from lying down or resting to walking, or change from walking to resting. °? Sit and rest in a tub of warm water. °? Drink enough fluid to keep your urine pale yellow. Dehydration may cause these contractions. °? Do slow and deep breathing several times an hour. °· Keep all follow-up prenatal visits as told by your health care provider. This is important. °Contact a health care provider if: °· You have a fever. °· You have continuous pain in your abdomen. °Get help right away if: °· Your contractions become stronger, more regular, and closer together. °· You have fluid leaking or gushing from your vagina. °· You pass blood-tinged mucus (bloody show). °· You have bleeding from your vagina. °· You have low back pain that you never had before. °· You feel your baby’s head pushing down and causing pelvic pressure. °· Your baby is not moving inside you as much as it used to. °Summary °· Contractions that occur before labor are called Braxton   Hicks contractions, false labor, or practice contractions. °· Braxton Hicks contractions are usually shorter, weaker, farther apart, and less regular than true labor contractions. True labor contractions usually become progressively stronger and regular and they become more frequent. °· Manage discomfort from Braxton Hicks contractions by  changing position, resting in a warm bath, drinking plenty of water, or practicing deep breathing. °This information is not intended to replace advice given to you by your health care provider. Make sure you discuss any questions you have with your health care provider. °Document Released: 02/02/2017 Document Revised: 02/02/2017 Document Reviewed: 02/02/2017 °Elsevier Interactive Patient Education © 2018 Elsevier Inc. ° °

## 2018-01-08 NOTE — MAU Provider Note (Signed)
History     CSN: [id number]  Arrival date and time: 01/08/18 1520   None     Chief Complaint  Patient presents with  . Contractions  . Vaginal Bleeding   HPI   [name] is a 32 y.o. female G1P0 @ 1829w0d here in MAU with spotting. [facility] and had her cervix checked. She went to the restroom after her visit and noticed a small amount of mucus/blood on her toilet tissue. No active bleeding. + fetal movement. No blood clots. Brown in color at times.  C-section is scheduled for 4/16 due to breech presentation. Failed version.   OB History    Gravida  1   Para      Term      Preterm      AB      Living        SAB      TAB      Ectopic      Multiple      Live Births              Past Medical History:  Diagnosis Date  . Medical history non-contributory     History reviewed. No pertinent surgical history.  Family History  Problem Relation Age of Onset  . Colon cancer Mother   . Gout Father   . Seizures Brother     Social History   Tobacco Use  . Smoking status: Never Smoker  . Smokeless tobacco: Never Used  Substance Use Topics  . Alcohol use: No  . Drug use: No    Allergies: No Known Allergies  Medications Prior to Admission  Medication Sig Dispense Refill Last Dose  . Elastic Bandages & Supports (COMFORT FIT MATERNITY SUPP MED) MISC 1 Device by Does not apply route daily. (Patient not taking: Reported on 01/01/2018) 1 each 1 Not Taking  . fluconazole (DIFLUCAN) 150 MG tablet TK 1 T PO FOR ONE DOSE 1 tablet 0   . pantoprazole (PROTONIX) 40 MG tablet Take 1 tablet (40 mg total) by mouth 2 (two) times daily before a meal. 60 tablet 2 Taking  . Prenatal Vit-Fe Phos-FA-Omega (VITAFOL GUMMIES) 3.33-0.333-34.8 MG CHEW Chew 75 mg by mouth every morning. 90 tablet 10 Taking  . ranitidine (ZANTAC) 150 MG tablet Take 1 tablet (150 mg total) by mouth 2 (two) times daily. (Patient not taking: Reported on 01/01/2018) 60 tablet 3 Not  Taking  . terconazole (TERAZOL 3) 0.8 % vaginal cream Place 1 applicator vaginally at bedtime. 20 g 0    No results found for this or any previous visit (from the past 48 hour(s)).  Review of Systems  Constitutional: Negative for fever.  Gastrointestinal: Positive for abdominal pain (Contraction like pain ).  Genitourinary: Positive for vaginal bleeding and vaginal discharge.   Physical Exam   Blood pressure 131/72, pulse 87, temperature 98.2 F (36.8 C), temperature source Oral, resp. rate 18, weight 145 lb 12 oz (66.1 kg), last menstrual period 04/17/2017, SpO2 98 %.  Physical Exam  Constitutional: She is oriented to person, place, and time. She appears well-developed and well-nourished. No distress.  HENT:  Head: Normocephalic.  Eyes: Pupils are equal, round, and reactive to light.  Respiratory: Effort normal.  GI: Soft. She exhibits no distension. There is no tenderness. There is no rebound.  Genitourinary:  Genitourinary Comments: Dilation: 1.5 Effacement (%): 90 Presentation: Complete Breech Small amount of bloody show noted.  Exam by:: Venia CarbonJennifer Hari Casaus, NP  Musculoskeletal: Normal range of motion.  Neurological: She is alert and oriented to person, place, and time.  Skin: Skin is warm. She is not diaphoretic.  Psychiatric: Her behavior is normal.   Fetal Tracing: Baseline: 135 bpm Variability: Moderate  Accelerations: 15x15 Decelerations: None Toco: Occasional, irregular pattern.   MAU Course  Procedures  None   MDM  Cervix essentially unchanged, Discussed exam with Dr. Erin Fulling who is agreeable with DC home.   Assessment and Plan   A:  1. Labor, false   2. Braxton Hicks contractions     P:  Discharge home with strict return precautions Labor precautions Return to MAU if symptoms worsen Bleeding precautions Follow up with OB as scheduled or sooner if needed Light diet tonight  Larya Charpentier, Harolyn Rutherford, NP 01/08/2018 6:17 PM

## 2018-01-08 NOTE — Progress Notes (Signed)
   PRENATAL VISIT NOTE  Subjective:  Livingston Dionesnna Carico is a 32 y.o. G1P0 at 3459w0d being seen today for ongoing prenatal care.  She is currently monitored for the following issues for this low-risk pregnancy and has Supervision of normal first pregnancy; GERD (gastroesophageal reflux disease); Influenza A; Positive GBS test; Malpresentation of fetus, antepartum; and Breech presentation of fetus on their problem list.  Patient reports no bleeding, no leaking, occasional contractions and vaginal irritation.  Contractions: Irregular. Vag. Bleeding: None.  Movement: Present. Denies leaking of fluid.   The following portions of the patient's history were reviewed and updated as appropriate: allergies, current medications, past family history, past medical history, past social history, past surgical history and problem list. Problem list updated.  Objective:   Vitals:   01/08/18 1348  BP: 125/81  Pulse: 99  Weight: 144 lb (65.3 kg)    Fetal Status: Fetal Heart Rate (bpm): 155; doppler Fundal Height: 38 cm Movement: Present  Presentation: Complete Breech  General:  Alert, oriented and cooperative. Patient is in no acute distress.  Skin: Skin is warm and dry. No rash noted.   Cardiovascular: Normal heart rate noted  Respiratory: Normal respiratory effort, no problems with respiration noted  Abdomen: Soft, gravid, appropriate for gestational age.  Pain/Pressure: Present     Pelvic: Cervical exam performed Dilation: 1 Effacement (%): 100 Station: Ballotable  Extremities: Normal range of motion.     Mental Status: Normal mood and affect. Normal behavior. Normal judgment and thought content.   Assessment and Plan:  Pregnancy: G1P0 at 4859w0d  1. Encounter for supervision of normal first pregnancy in third trimester     Discussed cervical change with Dr. Jolayne Pantheronstant.  POC primary C-section for complete breech scheduled for 01/16/18.  Has pre-op scheduled for 01/15/18.    2. Positive GBS test       3.  Breech presentation, single or unspecified fetus     Failed Version, PLTCS scheduled for 01/16/18. Term precautions reviewed including cord prolapse.   Term labor symptoms and general obstetric precautions including but not limited to vaginal bleeding, contractions, leaking of fluid and fetal movement were reviewed in detail with the patient. Please refer to After Visit Summary for other counseling recommendations.  Return in about 2 weeks (around 01/22/2018) for incision check.  Future Appointments  Date Time Provider Department Center  01/15/2018  9:45 AM [facility] PAT 5 [facility] None    Roe Coombsachelle A Emaad Nanna, CNM

## 2018-01-08 NOTE — MAU Note (Signed)
rtn appt at 1400, cx was "1 and very thin". When she got home, she saw blood and it scared her.  Has been having contractions since yesterday, now ~q 5min.   Baby is breach, scheduled for c/s on 4/16.

## 2018-01-09 ENCOUNTER — Inpatient Hospital Stay (HOSPITAL_COMMUNITY): Payer: Medicaid Other | Admitting: Anesthesiology

## 2018-01-09 ENCOUNTER — Encounter (HOSPITAL_COMMUNITY)
Admission: AD | Disposition: A | Discharge status: Home / Self Care | Payer: Self-pay | Source: Ambulatory Visit | Attending: Obstetrics & Gynecology

## 2018-01-09 ENCOUNTER — Other Ambulatory Visit: Payer: Self-pay | Admitting: Certified Nurse Midwife

## 2018-01-09 ENCOUNTER — Inpatient Hospital Stay (HOSPITAL_COMMUNITY)
Admission: AD | Admit: 2018-01-09 | Discharge: 2018-01-11 | DRG: 788 | Disposition: A | Discharge status: Home / Self Care | Payer: Medicaid Other | Source: Ambulatory Visit | Attending: Obstetrics & Gynecology | Admitting: Obstetrics & Gynecology

## 2018-01-09 ENCOUNTER — Encounter (HOSPITAL_COMMUNITY): Payer: Self-pay | Admitting: *Deleted

## 2018-01-09 DIAGNOSIS — O99824 Streptococcus B carrier state complicating childbirth: Secondary | ICD-10-CM | POA: Diagnosis present

## 2018-01-09 DIAGNOSIS — K219 Gastro-esophageal reflux disease without esophagitis: Secondary | ICD-10-CM | POA: Diagnosis present

## 2018-01-09 DIAGNOSIS — O9902 Anemia complicating childbirth: Secondary | ICD-10-CM | POA: Diagnosis present

## 2018-01-09 DIAGNOSIS — D649 Anemia, unspecified: Secondary | ICD-10-CM | POA: Diagnosis present

## 2018-01-09 DIAGNOSIS — O9962 Diseases of the digestive system complicating childbirth: Secondary | ICD-10-CM | POA: Diagnosis present

## 2018-01-09 DIAGNOSIS — Z3A38 38 weeks gestation of pregnancy: Secondary | ICD-10-CM

## 2018-01-09 DIAGNOSIS — O471 False labor at or after 37 completed weeks of gestation: Secondary | ICD-10-CM | POA: Diagnosis not present

## 2018-01-09 DIAGNOSIS — Z98891 History of uterine scar from previous surgery: Secondary | ICD-10-CM

## 2018-01-09 DIAGNOSIS — O321XX0 Maternal care for breech presentation, not applicable or unspecified: Principal | ICD-10-CM | POA: Diagnosis present

## 2018-01-09 DIAGNOSIS — O329XX0 Maternal care for malpresentation of fetus, unspecified, not applicable or unspecified: Secondary | ICD-10-CM

## 2018-01-09 HISTORY — DX: Meningitis, unspecified: G03.9

## 2018-01-09 LAB — CBC
HEMATOCRIT: 35.7 % — AB (ref 36.0–46.0)
Hemoglobin: 11.7 g/dL — ABNORMAL LOW (ref 12.0–15.0)
MCH: 26.8 pg (ref 26.0–34.0)
MCHC: 32.8 g/dL (ref 30.0–36.0)
MCV: 81.9 fL (ref 78.0–100.0)
PLATELETS: 234 10*3/uL (ref 150–400)
RBC: 4.36 MIL/uL (ref 3.87–5.11)
RDW: 14.1 % (ref 11.5–15.5)
WBC: 9 10*3/uL (ref 4.0–10.5)

## 2018-01-09 LAB — CERVICOVAGINAL ANCILLARY ONLY
Bacterial vaginitis: NEGATIVE
Candida vaginitis: POSITIVE — AB

## 2018-01-09 LAB — CREATININE, SERUM
CREATININE: 0.67 mg/dL (ref 0.44–1.00)
GFR calc Af Amer: >60 mL/min (ref >60)
GFR calc non Af Amer: >60 mL/min (ref >60)

## 2018-01-09 LAB — RPR: RPR: NONREACTIVE

## 2018-01-09 LAB — TYPE AND SCREEN
ABO/RH(D): O POS
ANTIBODY SCREEN: NEGATIVE

## 2018-01-09 LAB — ABO/RH: ABO/RH(D): O POS

## 2018-01-09 SURGERY — Surgical Case
Anesthesia: Spinal

## 2018-01-09 MED ORDER — MEPERIDINE HCL 25 MG/ML IJ SOLN: 6.2500 mg | INTRAMUSCULAR | Status: DC | PRN

## 2018-01-09 MED ORDER — PRENATAL MULTIVITAMIN CH
1.0000 | ORAL_TABLET | Freq: Every day | ORAL | Status: DC
  Administered 2018-01-10: 1 via ORAL
  Filled 2018-01-09: qty 1

## 2018-01-09 MED ORDER — ENOXAPARIN SODIUM 40 MG/0.4ML ~~LOC~~ SOLN
40.0000 mg | SUBCUTANEOUS | Status: DC
  Administered 2018-01-10 – 2018-01-11 (×2): 40 mg via SUBCUTANEOUS
  Filled 2018-01-09 (×2): qty 0.4

## 2018-01-09 MED ORDER — DIBUCAINE 1 % RE OINT: 1.0000 "application " | TOPICAL_OINTMENT | RECTAL | Status: DC | PRN

## 2018-01-09 MED ORDER — SENNOSIDES-DOCUSATE SODIUM 8.6-50 MG PO TABS
2.0000 | ORAL_TABLET | ORAL | Status: DC
  Administered 2018-01-10 (×2): 2 via ORAL
  Filled 2018-01-09 (×2): qty 2

## 2018-01-09 MED ORDER — OXYCODONE HCL 5 MG PO TABS: 10.0000 mg | ORAL_TABLET | ORAL | Status: DC | PRN

## 2018-01-09 MED ORDER — SODIUM CHLORIDE 0.9% FLUSH: 3.0000 mL | INTRAVENOUS | Status: DC | PRN

## 2018-01-09 MED ORDER — LACTATED RINGERS IV SOLN: INTRAVENOUS | Status: DC

## 2018-01-09 MED ORDER — LACTATED RINGERS IV SOLN
INTRAVENOUS | Status: DC
  Administered 2018-01-09 (×2): via INTRAVENOUS

## 2018-01-09 MED ORDER — WITCH HAZEL-GLYCERIN EX PADS: 1.0000 "application " | MEDICATED_PAD | CUTANEOUS | Status: DC | PRN

## 2018-01-09 MED ORDER — SIMETHICONE 80 MG PO CHEW
80.0000 mg | CHEWABLE_TABLET | ORAL | Status: DC
  Administered 2018-01-10 (×2): 80 mg via ORAL
  Filled 2018-01-09 (×2): qty 1

## 2018-01-09 MED ORDER — ONDANSETRON HCL 4 MG/2ML IJ SOLN
INTRAMUSCULAR | Status: AC
  Filled 2018-01-09: qty 2

## 2018-01-09 MED ORDER — ONDANSETRON HCL 4 MG/2ML IJ SOLN: 4.0000 mg | Freq: Three times a day (TID) | INTRAMUSCULAR | Status: DC | PRN

## 2018-01-09 MED ORDER — NALBUPHINE HCL 10 MG/ML IJ SOLN
5.0000 mg | INTRAMUSCULAR | Status: DC | PRN
  Administered 2018-01-09: 5 mg via INTRAVENOUS
  Filled 2018-01-09: qty 1

## 2018-01-09 MED ORDER — SCOPOLAMINE 1 MG/3DAYS TD PT72
MEDICATED_PATCH | TRANSDERMAL | Status: AC
  Filled 2018-01-09: qty 1

## 2018-01-09 MED ORDER — MORPHINE SULFATE (PF) 0.5 MG/ML IJ SOLN
INTRAMUSCULAR | Status: AC
  Filled 2018-01-09: qty 10

## 2018-01-09 MED ORDER — DIPHENHYDRAMINE HCL 25 MG PO CAPS: 25.0000 mg | ORAL_CAPSULE | Freq: Four times a day (QID) | ORAL | Status: DC | PRN

## 2018-01-09 MED ORDER — ACETAMINOPHEN 325 MG PO TABS: 650.0000 mg | ORAL_TABLET | ORAL | Status: DC | PRN

## 2018-01-09 MED ORDER — CEFAZOLIN SODIUM-DEXTROSE 2-4 GM/100ML-% IV SOLN
2.0000 g | INTRAVENOUS | Status: AC
  Administered 2018-01-09: 2 g via INTRAVENOUS

## 2018-01-09 MED ORDER — NALBUPHINE HCL 10 MG/ML IJ SOLN: 5.0000 mg | INTRAMUSCULAR | Status: DC | PRN

## 2018-01-09 MED ORDER — SIMETHICONE 80 MG PO CHEW: 80.0000 mg | CHEWABLE_TABLET | ORAL | Status: DC | PRN

## 2018-01-09 MED ORDER — TETANUS-DIPHTH-ACELL PERTUSSIS 5-2.5-18.5 LF-MCG/0.5 IM SUSP: 0.5000 mL | Freq: Once | INTRAMUSCULAR | Status: DC

## 2018-01-09 MED ORDER — LACTATED RINGERS IV SOLN
INTRAVENOUS | Status: DC
  Administered 2018-01-09: 125 mL/h via INTRAVENOUS

## 2018-01-09 MED ORDER — SOD CITRATE-CITRIC ACID 500-334 MG/5ML PO SOLN
30.0000 mL | Freq: Once | ORAL | Status: AC
  Administered 2018-01-09: 30 mL via ORAL
  Filled 2018-01-09: qty 15

## 2018-01-09 MED ORDER — KETOROLAC TROMETHAMINE 30 MG/ML IJ SOLN: 30.0000 mg | Freq: Four times a day (QID) | INTRAMUSCULAR | Status: AC | PRN

## 2018-01-09 MED ORDER — FENTANYL CITRATE (PF) 100 MCG/2ML IJ SOLN
INTRAMUSCULAR | Status: DC | PRN
  Administered 2018-01-09: 10 ug via INTRATHECAL

## 2018-01-09 MED ORDER — MORPHINE SULFATE (PF) 0.5 MG/ML IJ SOLN
INTRAMUSCULAR | Status: DC | PRN
  Administered 2018-01-09: .2 mg via INTRATHECAL

## 2018-01-09 MED ORDER — BUPIVACAINE IN DEXTROSE 0.75-8.25 % IT SOLN
INTRATHECAL | Status: DC | PRN
  Administered 2018-01-09: 1.5 mg via INTRATHECAL

## 2018-01-09 MED ORDER — SOD CITRATE-CITRIC ACID 500-334 MG/5ML PO SOLN: 30.0000 mL | ORAL | Status: DC

## 2018-01-09 MED ORDER — IBUPROFEN 600 MG PO TABS
600.0000 mg | ORAL_TABLET | Freq: Four times a day (QID) | ORAL | Status: DC
  Administered 2018-01-09 – 2018-01-11 (×7): 600 mg via ORAL
  Filled 2018-01-09 (×7): qty 1

## 2018-01-09 MED ORDER — ONDANSETRON HCL 4 MG/2ML IJ SOLN
INTRAMUSCULAR | Status: DC | PRN
  Administered 2018-01-09: 4 mg via INTRAVENOUS

## 2018-01-09 MED ORDER — SIMETHICONE 80 MG PO CHEW
80.0000 mg | CHEWABLE_TABLET | Freq: Three times a day (TID) | ORAL | Status: DC
  Administered 2018-01-09 – 2018-01-10 (×4): 80 mg via ORAL
  Filled 2018-01-09 (×4): qty 1

## 2018-01-09 MED ORDER — OXYCODONE HCL 5 MG PO TABS: 5.0000 mg | ORAL_TABLET | ORAL | Status: DC | PRN

## 2018-01-09 MED ORDER — PHENYLEPHRINE 8 MG IN D5W 100 ML (0.08MG/ML) PREMIX OPTIME
INJECTION | INTRAVENOUS | Status: AC
  Filled 2018-01-09: qty 100

## 2018-01-09 MED ORDER — COCONUT OIL OIL: 1.0000 "application " | TOPICAL_OIL | Status: DC | PRN

## 2018-01-09 MED ORDER — NALBUPHINE HCL 10 MG/ML IJ SOLN: 5.0000 mg | Freq: Once | INTRAMUSCULAR | Status: DC | PRN

## 2018-01-09 MED ORDER — NALOXONE HCL 4 MG/10ML IJ SOLN
1.0000 ug/kg/h | INTRAVENOUS | Status: DC | PRN
  Filled 2018-01-09: qty 5

## 2018-01-09 MED ORDER — SCOPOLAMINE 1 MG/3DAYS TD PT72
MEDICATED_PATCH | TRANSDERMAL | Status: DC | PRN
  Administered 2018-01-09: 1 via TRANSDERMAL

## 2018-01-09 MED ORDER — PHENYLEPHRINE 8 MG IN D5W 100 ML (0.08MG/ML) PREMIX OPTIME
INJECTION | INTRAVENOUS | Status: DC | PRN
  Administered 2018-01-09: 50 ug/min via INTRAVENOUS

## 2018-01-09 MED ORDER — OXYTOCIN 10 UNIT/ML IJ SOLN
INTRAMUSCULAR | Status: AC
  Filled 2018-01-09: qty 4

## 2018-01-09 MED ORDER — ZOLPIDEM TARTRATE 5 MG PO TABS: 5.0000 mg | ORAL_TABLET | Freq: Every evening | ORAL | Status: DC | PRN

## 2018-01-09 MED ORDER — FENTANYL CITRATE (PF) 100 MCG/2ML IJ SOLN
INTRAMUSCULAR | Status: AC
  Filled 2018-01-09: qty 2

## 2018-01-09 MED ORDER — FAMOTIDINE IN NACL 20-0.9 MG/50ML-% IV SOLN
20.0000 mg | Freq: Once | INTRAVENOUS | Status: AC
  Administered 2018-01-09: 20 mg via INTRAVENOUS
  Filled 2018-01-09: qty 50

## 2018-01-09 MED ORDER — DIPHENHYDRAMINE HCL 50 MG/ML IJ SOLN: 12.5000 mg | INTRAMUSCULAR | Status: DC | PRN

## 2018-01-09 MED ORDER — OXYTOCIN 10 UNIT/ML IJ SOLN
INTRAVENOUS | Status: DC | PRN
  Administered 2018-01-09: 40 [IU] via INTRAVENOUS

## 2018-01-09 MED ORDER — NALOXONE HCL 0.4 MG/ML IJ SOLN: 0.4000 mg | INTRAMUSCULAR | Status: DC | PRN

## 2018-01-09 MED ORDER — BUPIVACAINE HCL 0.5 % IJ SOLN
INTRAMUSCULAR | Status: DC | PRN
  Administered 2018-01-09: 30 mL

## 2018-01-09 MED ORDER — LACTATED RINGERS IV SOLN
INTRAVENOUS | Status: DC | PRN
  Administered 2018-01-09: 04:00:00 via INTRAVENOUS

## 2018-01-09 MED ORDER — PROMETHAZINE HCL 25 MG/ML IJ SOLN: 6.2500 mg | INTRAMUSCULAR | Status: DC | PRN

## 2018-01-09 MED ORDER — SCOPOLAMINE 1 MG/3DAYS TD PT72
1.0000 | MEDICATED_PATCH | Freq: Once | TRANSDERMAL | Status: DC
  Filled 2018-01-09: qty 1

## 2018-01-09 MED ORDER — FENTANYL CITRATE (PF) 100 MCG/2ML IJ SOLN: 25.0000 ug | INTRAMUSCULAR | Status: DC | PRN

## 2018-01-09 MED ORDER — KETOROLAC TROMETHAMINE 30 MG/ML IJ SOLN
INTRAMUSCULAR | Status: AC
Start: 2018-01-09 — End: 2018-01-09
  Filled 2018-01-09: qty 1

## 2018-01-09 MED ORDER — OXYTOCIN 40 UNITS IN LACTATED RINGERS INFUSION - SIMPLE MED: 2.5000 [IU]/h | INTRAVENOUS | Status: AC

## 2018-01-09 MED ORDER — KETOROLAC TROMETHAMINE 30 MG/ML IJ SOLN
30.0000 mg | Freq: Four times a day (QID) | INTRAMUSCULAR | Status: AC | PRN
  Administered 2018-01-09: 30 mg via INTRAMUSCULAR

## 2018-01-09 MED ORDER — BUPIVACAINE HCL (PF) 0.5 % IJ SOLN
INTRAMUSCULAR | Status: AC
  Filled 2018-01-09: qty 30

## 2018-01-09 MED ORDER — MENTHOL 3 MG MT LOZG: 1.0000 | LOZENGE | OROMUCOSAL | Status: DC | PRN

## 2018-01-09 MED ORDER — DIPHENHYDRAMINE HCL 25 MG PO CAPS: 25.0000 mg | ORAL_CAPSULE | ORAL | Status: DC | PRN

## 2018-01-09 MED ORDER — DEXAMETHASONE SODIUM PHOSPHATE 4 MG/ML IJ SOLN
INTRAMUSCULAR | Status: DC | PRN
  Administered 2018-01-09: 4 mg via INTRAVENOUS

## 2018-01-09 MED ORDER — DEXAMETHASONE SODIUM PHOSPHATE 4 MG/ML IJ SOLN
INTRAMUSCULAR | Status: AC
  Filled 2018-01-09: qty 1

## 2018-01-09 SURGICAL SUPPLY — 27 items
BENZOIN TINCTURE PRP APPL 2/3 (GAUZE/BANDAGES/DRESSINGS) ×2 IMPLANT
CHLORAPREP W/TINT 26ML (MISCELLANEOUS) ×2 IMPLANT
CLAMP CORD UMBIL (MISCELLANEOUS) IMPLANT
CLOSURE STERI STRIP 1/2 X4 (GAUZE/BANDAGES/DRESSINGS) ×2 IMPLANT
DRSG OPSITE POSTOP 4X10 (GAUZE/BANDAGES/DRESSINGS) ×2 IMPLANT
ELECT REM PT RETURN 9FT ADLT (ELECTROSURGICAL) ×2
ELECTRODE REM PT RTRN 9FT ADLT (ELECTROSURGICAL) ×1 IMPLANT
EXTRACTOR VACUUM M CUP 4 TUBE (SUCTIONS) IMPLANT
GLOVE BIOGEL PI IND STRL 6.5 (GLOVE) ×1 IMPLANT
GLOVE BIOGEL PI IND STRL 7.0 (GLOVE) ×1 IMPLANT
GLOVE BIOGEL PI INDICATOR 6.5 (GLOVE) ×1
GLOVE BIOGEL PI INDICATOR 7.0 (GLOVE) ×1
GLOVE SURG SS PI 6.0 STRL IVOR (GLOVE) ×2 IMPLANT
GOWN STRL REUS W/TWL LRG LVL3 (GOWN DISPOSABLE) ×4 IMPLANT
KIT ABG SYR 3ML LUER SLIP (SYRINGE) IMPLANT
NEEDLE HYPO 25X5/8 SAFETYGLIDE (NEEDLE) IMPLANT
NS IRRIG 1000ML POUR BTL (IV SOLUTION) ×2 IMPLANT
PACK C SECTION WH (CUSTOM PROCEDURE TRAY) ×2 IMPLANT
PAD OB MATERNITY 4.3X12.25 (PERSONAL CARE ITEMS) ×2 IMPLANT
PENCIL SMOKE EVAC W/HOLSTER (ELECTROSURGICAL) ×2 IMPLANT
RTRCTR C-SECT PINK 25CM LRG (MISCELLANEOUS) IMPLANT
SEPRAFILM MEMBRANE 5X6 (MISCELLANEOUS) IMPLANT
SUT PLAIN 0 NONE (SUTURE) IMPLANT
SUT VIC AB 0 CT1 36 (SUTURE) ×8 IMPLANT
SUT VIC AB 4-0 KS 27 (SUTURE) ×2 IMPLANT
TOWEL OR 17X24 6PK STRL BLUE (TOWEL DISPOSABLE) ×2 IMPLANT
TRAY FOLEY BAG SILVER LF 14FR (SET/KITS/TRAYS/PACK) ×2 IMPLANT

## 2018-01-09 NOTE — Anesthesia Postprocedure Evaluation (Signed)
Anesthesia Post Note  Patient: Kaitlyn Robinson  Procedure(s) Performed: PRIMARY CESAREAN SECTION (N/A )     Patient location during evaluation: Mother Baby Anesthesia Type: Spinal Level of consciousness: awake Pain management: satisfactory to patient Vital Signs Assessment: post-procedure vital signs reviewed and stable Respiratory status: spontaneous breathing Cardiovascular status: stable Anesthetic complications: no    Last Vitals:  Vitals:   01/09/18 1430 01/09/18 1505  BP:    Pulse:    Resp:    Temp:    SpO2: 97% 97%    Last Pain:  Vitals:   01/09/18 1315  TempSrc:   PainSc: 0-No pain   Pain Goal:                 KeyCorpBURGER,Kaitlyn Robinson

## 2018-01-09 NOTE — Progress Notes (Signed)
Dr Doroteo GlassmanPhelps is notified of new drainage on posterior section of the LTCS pressure dressing, VS Stable. Dr Doroteo GlassmanPhelps will come to evaluate the dressing.

## 2018-01-09 NOTE — Transfer of Care (Signed)
Immediate Anesthesia Transfer of Care Note  Patient: Kaitlyn Robinson  Procedure(s) Performed: PRIMARY CESAREAN SECTION (N/A )  Patient Location: PACU  Anesthesia Type:Spinal  Level of Consciousness: awake  Airway & Oxygen Therapy: Patient Spontanous Breathing  Post-op Assessment: Report given to RN and Post -op Vital signs reviewed and stable  Post vital signs: stable  Last Vitals:  Vitals Value Taken Time  BP 98/59 01/09/2018  5:03 AM  Temp    Pulse 75 01/09/2018  5:06 AM  Resp 14 01/09/2018  5:06 AM  SpO2 99 % 01/09/2018  5:06 AM  Vitals shown include unvalidated device data.  Last Pain:  Vitals:   01/09/18 0210  TempSrc:   PainSc: 10-Worst pain ever         Complications: No apparent anesthesia complications

## 2018-01-09 NOTE — MAU Note (Signed)
PT SAYS SHE WAS HERE EARLIER   VE 1-2 CM -  LEFT AT 630PM.     [facility]S  ON 4-16-  WITH DR CONSTANT .

## 2018-01-09 NOTE — Progress Notes (Signed)
Dr Doroteo GlassmanPhelps evaluates LTCS pressure dressing, no new orders received.

## 2018-01-09 NOTE — Anesthesia Procedure Notes (Signed)
Spinal  Patient location during procedure: OR Start time: 01/09/2018 3:46 AM End time: 01/09/2018 3:48 AM Staffing Anesthesiologist: Effie Berkshire, MD Preanesthetic Checklist Completed: patient identified, site marked, surgical consent, pre-op evaluation, timeout performed, IV checked, risks and benefits discussed and monitors and equipment checked Spinal Block Patient position: sitting Prep: DuraPrep Patient monitoring: heart rate, continuous pulse ox, blood pressure and cardiac monitor Approach: midline Location: L4-5 Injection technique: single-shot Needle Needle type: Introducer and Pencan  Needle gauge: 24 G Needle length: 9 cm Additional Notes Negative paresthesia. Negative blood return. Positive free-flowing CSF. Expiration date of kit checked and confirmed. Patient tolerated procedure well, without complications.

## 2018-01-09 NOTE — Addendum Note (Signed)
Addendum  created 01/09/18 1540 by Algis GreenhouseBurger, Jerin Franzel A, CRNA   Sign clinical note

## 2018-01-09 NOTE — Anesthesia Preprocedure Evaluation (Signed)
Anesthesia Evaluation  Patient identified by MRN, date of birth, ID band Patient awake    Reviewed: Allergy & Precautions, NPO status , Patient's Chart, lab work & pertinent test results  Airway Mallampati: I  TM Distance: >3 FB Neck ROM: Full    Dental no notable dental hx. (+) Dental Advisory Given   Pulmonary neg pulmonary ROS,    Pulmonary exam normal        Cardiovascular negative cardio ROS Normal cardiovascular exam     Neuro/Psych negative neurological ROS  negative psych ROS   GI/Hepatic Neg liver ROS, GERD  Medicated and Controlled,  Endo/Other  negative endocrine ROS  Renal/GU negative Renal ROS     Musculoskeletal   Abdominal   Peds  Hematology negative hematology ROS (+)   Anesthesia Other Findings   Reproductive/Obstetrics (+) Pregnancy                             Lab Results  Component Value Date   WBC 6.5 12/28/2017   HGB 10.9 (L) 12/28/2017   HCT 33.2 (L) 12/28/2017   MCV 81.6 12/28/2017   PLT 208 12/28/2017     Anesthesia Physical Anesthesia Plan  ASA: II and emergent  Anesthesia Plan: Spinal   Post-op Pain Management:    Induction:   PONV Risk Score and Plan: 1 and Ondansetron and Treatment may vary due to age or medical condition  Airway Management Planned: Natural Airway  Additional Equipment: None  Intra-op Plan:   Post-operative Plan:   Informed Consent: I have reviewed the patients History and Physical, chart, labs and discussed the procedure including the risks, benefits and alternatives for the proposed anesthesia with the patient or authorized representative who has indicated his/her understanding and acceptance.   Dental advisory given  Plan Discussed with: CRNA  Anesthesia Plan Comments:         Anesthesia Quick Evaluation

## 2018-01-09 NOTE — Anesthesia Postprocedure Evaluation (Signed)
Anesthesia Post Note  Patient: Kaitlyn Robinson  Procedure(s) Performed: PRIMARY CESAREAN SECTION (N/A )     Patient location during evaluation: PACU Anesthesia Type: Spinal Level of consciousness: oriented and awake and alert Pain management: pain level controlled Vital Signs Assessment: post-procedure vital signs reviewed and stable Respiratory status: spontaneous breathing, respiratory function stable and patient connected to nasal cannula oxygen Cardiovascular status: blood pressure returned to baseline and stable Postop Assessment: no headache, no backache, no apparent nausea or vomiting, spinal receding and patient able to bend at knees Anesthetic complications: no    Last Vitals:  Vitals:   01/09/18 0616 01/09/18 0617  BP:    Pulse: 69 69  Resp: 12 15  Temp:    SpO2: 100% 100%    Last Pain:  Vitals:   01/09/18 0600  TempSrc:   PainSc: 0-No pain   Pain Goal:                 Shelton SilvasKevin D Jazilyn Siegenthaler

## 2018-01-09 NOTE — H&P (Signed)
Obstetric Preoperative History and Physical  Kaitlyn Robinson is a 32 y.o. G1P0 with IUP at [redacted]w[redacted]d presenting in active labor and fetal malpresentation. Fetus has been breech presentation, failed ECV on 12/28/17. U/S shows fetus remains in breech presentation.   Prenatal Course Source of Care: [facility]  with onset of care at 11 weeks Pregnancy complications or risks: Patient Active Problem List   Diagnosis Date Noted  . Breech presentation of fetus 12/28/2017  . Malpresentation of fetus, antepartum 12/25/2017  . Positive GBS test 12/20/2017  . Influenza A 11/16/2017  . GERD (gastroesophageal reflux disease) 08/28/2017  . Supervision of normal first pregnancy 07/03/2017   She plans to breastfeed She desires oral progesterone-only contraceptive for postpartum contraception.   Prenatal labs and studies: ABO, Rh: O/Positive/-- (10/01 1100) Antibody: Negative (10/01 1100) Rubella: 1.30 (10/01 1100) RPR: Non Reactive (03/28 0810)  HBsAg: Negative (10/01 1100)  HIV: Non Reactive (01/21 1258)  ZOX:WRUEAVWU (03/18 1703) 2-hr GTT: normal Genetic screening normal Anatomy US normal, but limited  Prenatal Transfer Tool  Maternal Diabetes: No Genetic Screening: Normal Maternal Ultrasounds/Referrals: Normal Fetal Ultrasounds or other Referrals:  None Maternal Substance Abuse:  No Significant Maternal Medications:  None Significant Maternal Lab Results: Lab values include: Group B Strep positive  Past Medical History:  Diagnosis Date  . Medical history non-contributory   . Meningitis     Past Surgical History:  Procedure Laterality Date  . NO PAST SURGERIES      OB History  Gravida Para Term Preterm AB Living  1            SAB TAB Ectopic Multiple Live Births               # Outcome Date GA Lbr Len/2nd Weight Sex Delivery Anes PTL Lv  1 Current             Social History   Socioeconomic History  . Marital status: Single    Spouse name: Not on file  . Number of children: Not on  file  . Years of education: Not on file  . Highest education level: Not on file  Occupational History  . Not on file  Social Needs  . Financial resource strain: Not on file  . Food insecurity:    Worry: Not on file    Inability: Not on file  . Transportation needs:    Medical: Not on file    Non-medical: Not on file  Tobacco Use  . Smoking status: Never Smoker  . Smokeless tobacco: Never Used  Substance and Sexual Activity  . Alcohol use: No  . Drug use: No  . Sexual activity: Yes    Birth control/protection: None  Lifestyle  . Physical activity:    Days per week: Not on file    Minutes per session: Not on file  . Stress: Not on file  Relationships  . Social connections:    Talks on phone: Not on file    Gets together: Not on file    Attends religious service: Not on file    Active member of club or organization: Not on file    Attends meetings of clubs or organizations: Not on file    Relationship status: Not on file  Other Topics Concern  . Not on file  Social History Narrative  . Not on file    Family History  Problem Relation Age of Onset  . Colon cancer Mother   . Gout Father   . Seizures Brother  Medications Prior to Admission  Medication Sig Dispense Refill Last Dose  . fluconazole (DIFLUCAN) 150 MG tablet TK 1 T PO FOR ONE DOSE 1 tablet 0 01/08/2018 at Unknown time  . pantoprazole (PROTONIX) 40 MG tablet Take 1 tablet (40 mg total) by mouth 2 (two) times daily before a meal. 60 tablet 2 Past Month at Unknown time  . Prenatal Vit-Fe Phos-FA-Omega (VITAFOL GUMMIES) 3.33-0.333-34.8 MG CHEW Chew 75 mg by mouth every morning. 90 tablet 10 01/08/2018 at Unknown time  . ranitidine (ZANTAC) 150 MG tablet Take 1 tablet (150 mg total) by mouth 2 (two) times daily. 60 tablet 3 Past Month at Unknown time  . terconazole (TERAZOL 3) 0.8 % vaginal cream Place 1 applicator vaginally at bedtime. 20 g 0 Past Month at Unknown time  . Elastic Bandages & Supports (COMFORT FIT  MATERNITY SUPP MED) MISC 1 Device by Does not apply route daily. (Patient not taking: Reported on 01/01/2018) 1 each 1 More than a month at Unknown time    No Known Allergies  Review of Systems: Negative except for what is mentioned in HPI.  Physical Exam: BP 115/74 (BP Location: Right Arm)   Pulse 85   Temp 98.4 F (36.9 C) (Oral)   Resp 20   Ht 5' (1.524 m)   Wt 143 lb 12 oz (65.2 kg)   LMP 04/17/2017 (Exact Date)   BMI 28.07 kg/m   CONSTITUTIONAL: Well-developed, well-nourished female in no acute distress.  HENT:  Normocephalic, atraumatic. Oropharynx is clear and moist EYES: Conjunctivae and EOM are normal. No scleral icterus.  NECK: Normal range of motion, supple SKIN: Skin is warm and dry. No rash noted. Not diaphoretic. No erythema. NEUROLGIC: Alert and oriented to person, place, and time. Normal reflexes, muscle tone coordination. No cranial nerve deficit noted. PSYCHIATRIC: Normal mood and affect. Normal behavior. CARDIOVASCULAR: Normal heart rate noted, regular rhythm RESPIRATORY: Effort and breath sounds normal, no problems with respiration noted ABDOMEN: Soft, nontender, nondistended, gravid. PELVIC: 4/100/-2 MUSCULOSKELETAL: Normal range of motion. No edema and no tenderness.  FHT: baseline rate 135, moderate variability, +acel, no decel   Assessment and Plan :Kaitlyn Robinson is a 32 y.o. G1P0 at 4331w1d being admitted for cesarean section d/t breech presentation and active labor. The risks of cesarean section discussed with the patient included but were not limited to: bleeding which may require transfusion or reoperation; infection which may require antibiotics; injury to bowel, bladder, ureters or other surrounding organs; injury to the fetus; need for additional procedures including hysterectomy in the event of a life-threatening hemorrhage; placental abnormalities wth subsequent pregnancies, incisional problems, thromboembolic phenomenon and other postoperative/anesthesia  complications. The patient concurred with the proposed plan, giving informed written consent for the procedure. Patient has been NPO since 9 pm last night she will remain NPO for procedure. Anesthesia and OR aware. Preoperative prophylactic antibiotics and SCDs ordered on call to the OR. To OR when ready.    Kaitlyn FanningJulie P. Dyneshia Baccam, MD OB Fellow Faculty Practice, [facility]

## 2018-01-09 NOTE — Op Note (Signed)
Cesarean Section Operative Report  PATIENT: Kaitlyn Robinson  PROCEDURE DATE: 01/09/2018  PREOPERATIVE DIAGNOSES: Intrauterine pregnancy at [redacted]w[redacted]d weeks gestation; fetal malpresentation: breech  POSTOPERATIVE DIAGNOSES: The same  PROCEDURE: Primary Low Transverse Cesarean Section  SURGEON:   Surgeon(s) and Role:    * Willodean Rosenthal, MD - Primary - Attending    * Nolene Ebbs, MD - OB Fellow   INDICATIONS: Kaitlyn Robinson is a 32 y.o. G1P0 at [redacted]w[redacted]d here for cesarean section secondary to the indications listed under preoperative diagnoses; please see preoperative note for further details.  The risks of cesarean section were discussed with the patient including but were not limited to: bleeding which may require transfusion or reoperation; infection which may require antibiotics; injury to bowel, bladder, ureters or other surrounding organs; injury to the fetus; need for additional procedures including hysterectomy in the event of a life-threatening hemorrhage; placental abnormalities wth subsequent pregnancies, incisional problems, thromboembolic phenomenon and other postoperative/anesthesia complications.   The patient concurred with the proposed plan, giving informed written consent for the procedure.    FINDINGS:  Viable female infant in breech presentation.  Apgars 9 and 9.  Clear amniotic fluid.  Intact placenta, three vessel cord.  Normal uterus, fallopian tubes and ovaries bilaterally.  ANESTHESIA: Spinal INTRAVENOUS FLUIDS: 1,300 ml ESTIMATED BLOOD LOSS: 536 mL URINE OUTPUT:  450 ml SPECIMENS: Placenta sent to L&D COMPLICATIONS: None immediate  PROCEDURE IN DETAIL:  The patient preoperatively received intravenous antibiotics and had sequential compression devices applied to her lower extremities.  She was then taken to the operating room where spinal anesthesia was administered and was found to be adequate. She was then placed in a dorsal supine position with a leftward tilt, and prepped  and draped in a sterile manner.  A foley catheter was placed into her bladder and attached to constant gravity.    After an adequate timeout was performed, a Pfannenstiel skin incision was made with scalpel and carried through to the underlying layer of fascia. The fascia was incised in the midline, and this incision was extended bilaterally using the Mayo scissors.  Kocher clamps were applied to the superior aspect of the fascial incision and the underlying rectus muscles were dissected off bluntly and with Mayo scissors.  A similar process was carried out on the inferior aspect of the fascial incision. The rectus muscles were separated in the midline bluntly and the peritoneum was entered bluntly. Attention was turned to the lower uterine segment where the uterovesical peritoneal reflection was identified and a bladder flap was created. Then a low transverse hysterotomy was made with a scalpel and extended bilaterally bluntly.  The infant was successfully delivered in breech presentation, the cord was clamped and cut after one minute, and the infant was handed over to the awaiting neonatology team. Uterine massage was then administered, and the placenta delivered intact with a three-vessel cord. The uterus was exteriorized, and then cleared of clots and debris.  The hysterotomy was closed with 0 Vicryl in a running locked fashion, and an imbricating layer was also placed with 0 Vicryl, with hemostasis achieved. The uterus was placed back in the pelvis, and the pelvis was cleared of all clot and debris. Hemostasis was confirmed on all surfaces.  The peritoneum and the rectus muscles were reapproximated using a 0 Vicryl stitch. The fascia was then closed using 0 Vicryl in a running fashion.  The subcutaneous layer was irrigated, and 30 ml of 0.5% Marcaine was injected subcutaneously around the incision.  The skin  was closed with a 4-0 Vicryl subcuticular stitch.   The patient tolerated the procedure well.  Sponge, lap, instrument and needle counts were correct x 3.  She was taken to the recovery room in stable condition.   Disposition: PACU - hemodynamically stable.   Maternal Condition: stable    Signed: Frederik PearJulie P Degele, MD OB Fellow 01/09/2018 4:50 AM

## 2018-01-10 ENCOUNTER — Encounter: Payer: Self-pay | Admitting: Certified Nurse Midwife

## 2018-01-10 ENCOUNTER — Encounter: Payer: Self-pay | Admitting: Obstetrics and Gynecology

## 2018-01-10 ENCOUNTER — Encounter (HOSPITAL_COMMUNITY): Payer: Self-pay | Admitting: *Deleted

## 2018-01-10 LAB — CBC
HCT: 26.1 % — ABNORMAL LOW (ref 36.0–46.0)
Hemoglobin: 8.6 g/dL — ABNORMAL LOW (ref 12.0–15.0)
MCH: 27.1 pg (ref 26.0–34.0)
MCHC: 33 g/dL (ref 30.0–36.0)
MCV: 82.3 fL (ref 78.0–100.0)
PLATELETS: 162 10*3/uL (ref 150–400)
RBC: 3.17 MIL/uL — ABNORMAL LOW (ref 3.87–5.11)
RDW: 14.3 % (ref 11.5–15.5)
WBC: 12.8 10*3/uL — ABNORMAL HIGH (ref 4.0–10.5)

## 2018-01-10 LAB — BIRTH TISSUE RECOVERY COLLECTION (PLACENTA DONATION)

## 2018-01-10 MED ORDER — FERROUS SULFATE 325 (65 FE) MG PO TABS
325.0000 mg | ORAL_TABLET | Freq: Two times a day (BID) | ORAL | Status: DC
  Administered 2018-01-10 – 2018-01-11 (×3): 325 mg via ORAL
  Filled 2018-01-10 (×3): qty 1

## 2018-01-10 NOTE — Plan of Care (Signed)
  Problem: Education: Goal: Knowledge of General Education information will improve Outcome: Progressing   Problem: Clinical Measurements: Goal: Will remain free from infection Outcome: Progressing Note:  Honeycomb dressing dry & intact, no drainage noted; no s/s of infection of site.   Problem: Nutrition: Goal: Adequate nutrition will be maintained Outcome: Progressing   Problem: Elimination: Goal: Will not experience complications related to bowel motility Outcome: Progressing Note:  Pt passing gas.  States she attempted to stool today, but unable & still feels the need to do so.  Given warm prune/apple juice; awaiting results. Goal: Will not experience complications related to urinary retention Outcome: Progressing Note:  Pt has been voiding large amts of urine during this shift.  Pt is reminded to attempt to void more frequently to control bleeding.   Problem: Pain Managment: Goal: General experience of comfort will improve Outcome: Progressing Note:  Pt has realistic pain expectations.  Has only required scheduled pain meds for pain management.   Problem: Safety: Goal: Ability to remain free from injury will improve Outcome: Progressing   Problem: Education: Goal: Knowledge of condition will improve Outcome: Progressing   Problem: Activity: Goal: Will verbalize the importance of balancing activity with adequate rest periods Outcome: Progressing Goal: Ability to tolerate increased activity will improve Outcome: Progressing Note:  Pt has ambulated in hall today x 2; able to teach back importance of ambulating.   Problem: Life Cycle: Goal: Chance of risk for complications during the postpartum period will decrease Outcome: Progressing Note:  Fundas firm, lochia WNL.   Problem: Role Relationship: Goal: Ability to demonstrate positive interaction with newborn will improve Outcome: Progressing

## 2018-01-10 NOTE — Lactation Note (Signed)
This note was copied from a baby's chart. Lactation Consultation Note Baby 26 hrs old. Mom is supplementing d/t less than 6 lbs now. Mom is in agreement in supplementing. Asked RN to give Similac 22 cal. Instead of Gerber. Mom has round breast w/everted nipples. Expressed colostrum.  Mom has DEBP at bedside, discussed purpose for pumping and milk storage. Mom knows to pump q3h for 15-20 min. LPI information given and reviewed d/t less than 6 lbs.  Mom encouraged to feed baby 8-12 times/24 hours and with feeding cues. Newborn behavior, feeding habits, STS, I&O, cluster feeding, supply and demand reviewed. Encouraged to call for questions or assistance. Mom speaks good English no interpreter needed. [facility]/LC brochure given w/resources, support groups and LC services.  Patient Name: Kaitlyn Robinson WUJWJ'XToday's Date: 01/10/2018 Reason for consult: Initial assessment;Infant < 6lbs;Early term 37-38.6wks   Maternal Data Has patient been taught Hand Expression?: Yes Does the patient have breastfeeding experience prior to this delivery?: No  Feeding Feeding Type: Formula  LATCH Score                   Interventions Interventions: Breast feeding basics reviewed;Support pillows;Skin to skin;Expressed milk;Hand express;Pre-pump if needed;Breast compression;DEBP  Lactation Tools Discussed/Used Tools: Pump Breast pump type: Double-Electric Breast Pump WIC Program: Yes Pump Review: Setup, frequency, and cleaning;Milk Storage Initiated by:: RN Date initiated:: 01/09/18   Consult Status Consult Status: Follow-up Date: 01/10/18 Follow-up type: In-patient    Charyl DancerCARVER, Deshawn Skelley G 01/10/2018, 6:25 AM

## 2018-01-10 NOTE — Lactation Note (Signed)
This note was copied from a baby's chart. Lactation Consultation Note  Patient Name: Kaitlyn Robinson JYNWG'NToday's Date: 01/10/2018     LC Follow up visit for < 6 lb infant  [facility] but wanted to follow up to be sure mother was continuing to pump.    Mother stated she has only pumped one time today.  LC encouraged mother to pump every 3 hours to increase milk supply for baby.  She has a DEBP set up at bedside and knows how to use it.  LC encouraged to always put baby to breast first (8-12 times/24 hours or with feeding cues) and post pump.  Encouraged to supplement with 10-20 mls formula after each feeding.  Mother verbalized understanding.  She has no further questions/concerns at this time.               Kaitlyn Robinson 01/10/2018, 6:55 PM

## 2018-01-10 NOTE — Progress Notes (Signed)
Subjective: Postpartum Day 1: Cesarean Delivery Patient reports tolerating PO, + flatus and no problems voiding.  Pain well controlled.  Objective: Vital signs in last 24 hours: Temp:  [97.8 F (36.6 C)-98.9 F (37.2 C)] 98.1 F (36.7 C) (04/10 0426) Pulse Rate:  [68-84] 84 (04/10 0426) Resp:  [18] 18 (04/10 0426) BP: (94-114)/(44-79) 98/79 (04/10 0426) SpO2:  [97 %-100 %] 99 % (04/10 0220)  Physical Exam:  General: alert, cooperative and no distress Lochia: appropriate Uterine Fundus: firm Incision: no significant drainage DVT Evaluation: No evidence of DVT seen on physical exam.  Recent Labs    01/09/18 0238 01/10/18 0512  HGB 11.7* 8.6*  HCT 35.7* 26.1*    Assessment/Plan: Status post Cesarean section. Doing well postoperatively.  Continue current care Plan for discharge tomorrow.  Kaitlyn AdaJazma Amahia Madonia, DO 01/10/2018, 8:41 AM

## 2018-01-11 MED ORDER — OXYCODONE HCL 5 MG PO TABS: 5.0000 mg | ORAL_TABLET | ORAL | 0 refills | Status: DC | PRN

## 2018-01-11 MED ORDER — NORETHINDRONE 0.35 MG PO TABS: 1.0000 | ORAL_TABLET | Freq: Every day | ORAL | 11 refills | Status: DC

## 2018-01-11 MED ORDER — IBUPROFEN 600 MG PO TABS: 600.0000 mg | ORAL_TABLET | Freq: Four times a day (QID) | ORAL | 0 refills | Status: DC

## 2018-01-11 NOTE — Discharge Summary (Signed)
OB Discharge Summary     Patient Name: Kaitlyn Robinson DOB: July 23, 1986 MRN: [id number]  Date of admission: 01/09/2018 Delivering MD: Frederik Pear   Date of discharge: 01/11/2018  Admitting diagnosis: 38 WEEKS CTX  Intrauterine pregnancy: [redacted]w[redacted]d     Secondary diagnosis:  Principal Problem:   Status post primary low transverse cesarean section Active Problems:   Breech presentation of fetus  Additional problems: Anemia     Discharge diagnosis: Term Pregnancy Delivered and Breech presentation after failed version, Anemia                                                                                          Post partum procedures:none  Augmentation: none  Complications: None  Hospital course:  Onset of Labor With Unplanned C/S  32 y.o. yo G1P1001 at [redacted]w[redacted]d was admitted in Active Labor on 01/09/2018. Patient had a labor course significant for Malpresentation (breech) after failed version. Membrane Rupture Time/Date: 4:11 AM ,01/09/2018   The patient went for cesarean section due to Malpresentation, and delivered a Viable infant,01/09/2018  Details of operation can be found in separate operative note. Patient had an uncomplicated postpartum course.  She is ambulating,tolerating a regular diet, passing flatus, and urinating well.  Patient is discharged home in stable condition 01/11/18.  Physical exam  Vitals:   01/09/18 2336 01/10/18 0220 01/10/18 0426 01/11/18 0239  BP:   98/79 110/68  Pulse:   84 79  Resp:   18 16  Temp:   98.1 F (36.7 C) 98.4 F (36.9 C)  TempSrc:   Oral   SpO2: 100% 99%  99%  Weight:      Height:       General: alert, cooperative and no distress Lochia: appropriate Uterine Fundus: firm Incision: Healing well with no significant drainage, No significant erythema, Dressing is clean, dry, and intact DVT Evaluation: No evidence of DVT seen on physical exam. Labs: Lab Results  Component Value Date   WBC 12.8 (H) 01/10/2018   HGB 8.6 (L) 01/10/2018   HCT 26.1  (L) 01/10/2018   MCV 82.3 01/10/2018   PLT 162 01/10/2018   CMP Latest Ref Rng & Units 01/09/2018  Glucose 70 - 99 mg/dL -  BUN 6 - 23 mg/dL -  Creatinine 4.09 - 8.11 mg/dL 9.14  Sodium 782 - 956 mEq/L -  Potassium 3.5 - 5.1 mEq/L -  Chloride 96 - 112 mEq/L -  CO2 19 - 32 mEq/L -  Calcium 8.4 - 10.5 mg/dL -  Total Protein 6.0 - 8.3 g/dL -  Total Bilirubin 0.3 - 1.2 mg/dL -  Alkaline Phos 39 - 213 U/L -  AST 0 - 37 U/L -  ALT 0 - 35 U/L -    Discharge instruction: per After Visit Summary and "Baby and Me Booklet".  After visit meds:  Allergies as of 01/11/2018   No Known Allergies     Medication List    TAKE these medications   COMFORT FIT MATERNITY SUPP MED Misc 1 Device by Does not apply route daily.   fluconazole 150 MG tablet Commonly known as:  DIFLUCAN TK 1 T  PO FOR ONE DOSE   ibuprofen 600 MG tablet Commonly known as:  ADVIL,MOTRIN Take 1 tablet (600 mg total) by mouth every 6 (six) hours.   oxyCODONE 5 MG immediate release tablet Commonly known as:  Oxy IR/ROXICODONE Take 1 tablet (5 mg total) by mouth every 4 (four) hours as needed (pain scale 4-7).   pantoprazole 40 MG tablet Commonly known as:  PROTONIX Take 1 tablet (40 mg total) by mouth 2 (two) times daily before a meal.   ranitidine 150 MG tablet Commonly known as:  ZANTAC Take 1 tablet (150 mg total) by mouth 2 (two) times daily.   terconazole 0.8 % vaginal cream Commonly known as:  TERAZOL 3 Place 1 applicator vaginally at bedtime.   VITAFOL GUMMIES 3.33-0.333-34.8 MG Chew Chew 75 mg by mouth every morning.       Diet: routine diet  Activity: Advance as tolerated. Pelvic rest for 6 weeks.   Outpatient follow up:2 weeks Follow up Appt: Future Appointments  Date Time Provider Department Center  01/30/2018  8:45 AM Orvilla Cornwallenney, Rachelle A, CNM [facility] None   Follow up Visit:No follow-ups on file.  Postpartum contraception: Progesterone only pills  Newborn Data: Live born female  Birth  Weight: 6 lb 1.5 oz (2765 g) APGAR: 9, 9  Newborn Delivery   Birth date/time:  01/09/2018 04:13:00 Delivery type:  C-Section, Low Vertical Trial of labor:  No C-section categorization:  Primary     Baby Feeding: Breast Disposition:home with mother   01/11/2018 Wynelle BourgeoisMarie Edgar Reisz, CNM

## 2018-01-11 NOTE — Lactation Note (Signed)
This note was copied from a baby's chart. Lactation Consultation Note  Patient Name: Kaitlyn Robinson OZHYQ'MToday's Date: 01/11/2018 Reason for consult: Follow-up assessment;1st time breastfeeding;Primapara;Infant < 6lbs;Early term 37-38.6wks  LC visit prior to discharge:  Follow up with mother after yesterday's visit to see infant's progress with feeding.  Mother states she has been pumping more often after we spoke yesterday.  LC noticed infant had not been fed in 5 hours and reminded mother of the importance of feeding 8-12 times in a 24 hour period or earlier if infant shows cues.  Mother continues to supplement after breastfeeding.  Encouraged her to feed between 20-30 mls after breastfeeding.  She is using a curved tipped syringe and has no difficulty with this.    Infant was latched upon LC arrival.  LC scored infant's latch at a 10.  Mother with no pain and no questions/concerns related to breastfeeding.  Engorgement prevention/treatment discussed with mother.Mom made aware of O/P services, breastfeeding support groups, community resources, and our phone # for post-discharge questions.   [facility] after discharge if she does not purchase her own.   Maternal Data Formula Feeding for Exclusion: No Has patient been taught Hand Expression?: Yes Does the patient have breastfeeding experience prior to this delivery?: No  Feeding Feeding Type: Breast Fed Length of feed: 15 min  LATCH Score Latch: Grasps breast easily, tongue down, lips flanged, rhythmical sucking.  Audible Swallowing: Spontaneous and intermittent  Type of Nipple: Everted at rest and after stimulation  Comfort (Breast/Nipple): Soft / non-tender  Hold (Positioning): No assistance needed to correctly position infant at breast.  LATCH Score: 10  Interventions Interventions: Breast feeding basics reviewed;Skin to skin;Hand express;Position options;Support pillows;Adjust  position;DEBP;Hand pump  Lactation Tools Discussed/Used WIC Program: Yes   Consult Status Consult Status: Complete Date: 01/11/18 Follow-up type: Call as needed    Callum Wolf R [name] 01/11/2018, 9:59 AM

## 2018-01-11 NOTE — Discharge Instructions (Signed)

## 2018-01-15 ENCOUNTER — Encounter (HOSPITAL_COMMUNITY)
Admission: RE | Admit: 2018-01-15 | Discharge: 2018-01-15 | Disposition: A | Discharge status: Home / Self Care | Payer: Medicaid Other | Source: Ambulatory Visit

## 2018-01-23 ENCOUNTER — Ambulatory Visit: Payer: Medicaid Other | Admitting: Certified Nurse Midwife

## 2018-01-30 ENCOUNTER — Ambulatory Visit (INDEPENDENT_AMBULATORY_CARE_PROVIDER_SITE_OTHER): Payer: Medicaid Other | Admitting: Certified Nurse Midwife

## 2018-01-30 ENCOUNTER — Encounter: Payer: Self-pay | Admitting: Certified Nurse Midwife

## 2018-01-30 DIAGNOSIS — Z98891 History of uterine scar from previous surgery: Secondary | ICD-10-CM

## 2018-01-30 DIAGNOSIS — Z1389 Encounter for screening for other disorder: Secondary | ICD-10-CM

## 2018-01-30 NOTE — Progress Notes (Signed)
Post Partum Exam  Kaitlyn Robinson is a 32 y.o. G29P1001 female who presents for a postpartum visit. She is 3 weeks postpartum following a low cervical transverse Cesarean section, PLTCS for Breech presentaion. I have fully reviewed the prenatal and intrapartum course. The delivery was at 38.1 gestational weeks.  Anesthesia: spinal. Postpartum course has been doing well. Baby's course has been doing well. Baby is feeding by breast. Bleeding staining only. Bowel function is normal. Bladder function is normal. Patient is not sexually active. Contraception method is abstinence. Postpartum depression screening:neg, score 0.  The following portions of the patient's history were reviewed and updated as appropriate: allergies, current medications, past family history, past medical history, past social history, past surgical history and problem list. Last pap smear done 07/2017 and was Normal  Review of Systems Pertinent items noted in HPI and remainder of comprehensive ROS otherwise negative.    Objective:  Last menstrual period 04/17/2017, unknown if currently breastfeeding.  General:  alert, cooperative and no distress   Breasts:  inspection negative, no nipple discharge or bleeding, no masses or nodularity palpable  Lungs: clear to auscultation bilaterally  Heart:  regular rate and rhythm, S1, S2 normal, no murmur, click, rub or gallop  Abdomen: soft, non-tender; bowel sounds normal; no masses,  no organomegaly.  C/S wound: edges well approximated, no s/s infection noted, non-tender to palpation, no drainage noted, no erythema noted.    Pelvic/Rectal Exam: Not performed.        Assessment:    Normal 3 week postpartum exam. Pap smear not done at today's visit.   S/P C-section  Plan:   1. Contraception: abstinence 2. Start on POP, contraception discussed.  Activity discussed.  3. Follow up in: 4 weeks or as needed.

## 2018-03-05 ENCOUNTER — Ambulatory Visit: Payer: Medicaid Other | Admitting: Certified Nurse Midwife

## 2018-03-05 ENCOUNTER — Encounter: Payer: Self-pay | Admitting: Certified Nurse Midwife

## 2018-03-05 ENCOUNTER — Ambulatory Visit (INDEPENDENT_AMBULATORY_CARE_PROVIDER_SITE_OTHER): Payer: Medicaid Other | Admitting: Certified Nurse Midwife

## 2018-03-05 NOTE — Progress Notes (Signed)
Post Partum Exam  Kaitlyn Robinson is a 32 y.o. 951P1001 female who presents for a postpartum visit. She is 8 weeks postpartum following a low cervical transverse Cesarean section. I have fully reviewed the prenatal and intrapartum course. The delivery was at 139w1d gestational weeks.  Anesthesia: spinal. Postpartum course has been unremarkable. Baby's course has been unremarkable other than hip dysplasia (pt states baby was recently fitted for braces at pediatrician.). Baby is feeding by breast. Bleeding no bleeding. Bowel function is normal. Bladder function is normal. Patient is not sexually active. Contraception method is oral progesterone-only contraceptive. Postpartum depression screening:neg  The following portions of the patient's history were reviewed and updated as appropriate: allergies, current medications, past family history, past medical history, past social history, past surgical history and problem list. Last pap smear done 07/03/17 and was Normal  Review of Systems Pertinent items noted in HPI and remainder of comprehensive ROS otherwise negative.    Objective:  Last menstrual period 04/17/2017, unknown if currently breastfeeding.  General:  alert, cooperative and no distress   Breasts:  inspection negative, no nipple discharge or bleeding, no masses or nodularity palpable  Lungs: clear to auscultation bilaterally  Heart:  regular rate and rhythm, S1, S2 normal, no murmur, click, rub or gallop  Abdomen: soft, non-tender; bowel sounds normal; no masses,  no organomegaly.  C/S wound: healed: well approximated, no s/s infection   Vulva:  normal  Vagina: normal vagina, no discharge, exudate, lesion, or erythema  Cervix:  no cervical motion tenderness  Corpus: normal size, contour, position, consistency, mobility, non-tender  Adnexa:  normal adnexa  Rectal Exam: Not performed.        Assessment:    Normal 8 week postpartum exam. Pap smear not done at today's visit.   Plan:   1.  Contraception: abstinence 2. Follow up in: 6 months for annual exam or as needed.

## 2018-04-26 ENCOUNTER — Telehealth: Payer: Self-pay

## 2018-04-26 ENCOUNTER — Other Ambulatory Visit: Payer: Self-pay | Admitting: Obstetrics

## 2018-04-26 DIAGNOSIS — B3731 Acute candidiasis of vulva and vagina: Secondary | ICD-10-CM

## 2018-04-26 DIAGNOSIS — B373 Candidiasis of vulva and vagina: Secondary | ICD-10-CM

## 2018-04-26 MED ORDER — TERCONAZOLE 0.8 % VA CREA
1.0000 | TOPICAL_CREAM | Freq: Every day | VAGINAL | 0 refills | Status: DC
Start: 2018-04-26 — End: 2021-02-24

## 2018-04-26 NOTE — Telephone Encounter (Signed)
Terazol 3 Rx

## 2018-04-26 NOTE — Telephone Encounter (Signed)
Pt requesting yeast Rx that is safe to take while breast feeding  Pt pharmacy is [facility] on [address]

## 2018-04-27 NOTE — Telephone Encounter (Signed)
TC to pt to make aware pt not  Ava Left detailed message.

## 2018-05-14 ENCOUNTER — Other Ambulatory Visit: Payer: Self-pay | Admitting: Obstetrics

## 2018-05-14 ENCOUNTER — Telehealth: Payer: Self-pay

## 2018-05-14 DIAGNOSIS — B3731 Acute candidiasis of vulva and vagina: Secondary | ICD-10-CM

## 2018-05-14 DIAGNOSIS — B373 Candidiasis of vulva and vagina: Secondary | ICD-10-CM

## 2018-05-14 NOTE — Telephone Encounter (Signed)
TC to pt regarding frequent yeast infections. Pt is still breast feeding Pt is not consuming lot of sweets. Has a cup of coffee each morning.  Pt admits to using baby soap to wash vaginal area. Pt advised to discontinue any soaps for now Please advise on medication management. Pt noted yeast infections return After use of Rx. Pt has not been sexually active.

## 2018-05-16 ENCOUNTER — Other Ambulatory Visit: Payer: Self-pay

## 2018-05-21 ENCOUNTER — Other Ambulatory Visit (HOSPITAL_COMMUNITY)
Admission: RE | Admit: 2018-05-21 | Discharge: 2018-05-21 | Disposition: A | Discharge status: Home / Self Care | Payer: Medicaid Other | Source: Ambulatory Visit | Attending: Obstetrics | Admitting: Obstetrics

## 2018-05-21 ENCOUNTER — Ambulatory Visit (INDEPENDENT_AMBULATORY_CARE_PROVIDER_SITE_OTHER): Payer: Medicaid Other | Admitting: Obstetrics

## 2018-05-21 VITALS — BP 122/72 | HR 80 | Wt 123.2 lb

## 2018-05-21 DIAGNOSIS — N898 Other specified noninflammatory disorders of vagina: Secondary | ICD-10-CM | POA: Diagnosis not present

## 2018-05-21 DIAGNOSIS — B373 Candidiasis of vulva and vagina: Secondary | ICD-10-CM

## 2018-05-21 DIAGNOSIS — B3731 Acute candidiasis of vulva and vagina: Secondary | ICD-10-CM

## 2018-05-21 DIAGNOSIS — Z131 Encounter for screening for diabetes mellitus: Secondary | ICD-10-CM | POA: Diagnosis not present

## 2018-05-21 MED ORDER — TERCONAZOLE 0.8 % VA CREA: 1.0000 | TOPICAL_CREAM | Freq: Every day | VAGINAL | 2 refills | Status: DC

## 2018-05-21 NOTE — Progress Notes (Signed)
SUBJECTIVE:  32 y.o. female complains of white vaginal discharge for Months month(s). Denies abnormal vaginal bleeding or significant pelvic pain or fever. No UTI symptoms. Denies history of known exposure to STD.  No LMP recorded.  OBJECTIVE:  She appears well, afebrile. Urine dipstick: not done.  ASSESSMENT:  Vaginal Discharge, itching  No Vaginal Odor   PLAN:  GC, chlamydia, trichomonas, BVAG, CVAG probe sent to lab. Treatment: To be determined once lab results are received ROV prn if symptoms persist or worsen.  1. Vaginal discharge Rx: - Cervicovaginal ancillary only  2. Vaginal pruritus  3. Candidal vaginitis Rx: - HgB A1c - terconazole (TERAZOL 3) 0.8 % vaginal cream; Place 1 applicator vaginally at bedtime.  Dispense: 20 g; Refill: 2  4. Screening for diabetes mellitus Rx: - HgB A1c  I have reviewed the chart and agree with nursing staff's documentation of this patient's encounter.  Coral Ceoharles Harper, MD 05/21/2018 3:39 PM

## 2018-05-22 LAB — CERVICOVAGINAL ANCILLARY ONLY
Bacterial vaginitis: NEGATIVE
Candida vaginitis: POSITIVE — AB
Chlamydia: NEGATIVE
Neisseria Gonorrhea: NEGATIVE
Trichomonas: NEGATIVE

## 2018-05-22 LAB — HEMOGLOBIN A1C
ESTIMATED AVERAGE GLUCOSE: 105 mg/dL
HEMOGLOBIN A1C: 5.3 % (ref 4.8–5.6)

## 2018-05-23 ENCOUNTER — Other Ambulatory Visit: Payer: Self-pay | Admitting: Obstetrics

## 2018-12-17 ENCOUNTER — Ambulatory Visit: Payer: Medicaid Other | Admitting: Internal Medicine

## 2018-12-17 ENCOUNTER — Other Ambulatory Visit: Payer: Self-pay

## 2020-09-08 LAB — OB RESULTS CONSOLE RUBELLA ANTIBODY, IGM: Rubella: IMMUNE

## 2020-09-08 LAB — OB RESULTS CONSOLE RPR: RPR: NONREACTIVE

## 2020-09-08 LAB — OB RESULTS CONSOLE HIV ANTIBODY (ROUTINE TESTING): HIV: NONREACTIVE

## 2020-09-08 LAB — OB RESULTS CONSOLE HEPATITIS B SURFACE ANTIGEN: Hepatitis B Surface Ag: NEGATIVE

## 2020-09-10 LAB — OB RESULTS CONSOLE GC/CHLAMYDIA
Chlamydia: NEGATIVE
Gonorrhea: NEGATIVE

## 2020-09-14 ENCOUNTER — Encounter: Payer: Medicaid Other | Admitting: Advanced Practice Midwife

## 2020-10-03 NOTE — L&D Delivery Note (Signed)
Delivery Note At 3:57 PM a viable and healthy female was delivered via VBAC, Spontaneous (Presentation: Left Occiput Anterior).  APGAR: 9, 9; weight 5 lb 13.3 oz (2645 g).   Placenta status: Spontaneous, Intact.  Cord: 3 vessels with the following complications: None.  Anesthesia: Epidural Episiotomy: None Lacerations: 1st degree;Perineal Suture Repair: 3.0 vicryl Est. Blood Loss (mL): 250  Mom to postpartum.  Baby to Couplet care / Skin to Skin.  Prescilla Sours MD. 03/17/2021, 6:10 PM

## 2020-12-11 ENCOUNTER — Other Ambulatory Visit: Payer: Self-pay | Admitting: Obstetrics & Gynecology

## 2020-12-11 DIAGNOSIS — O365921 Maternal care for other known or suspected poor fetal growth, second trimester, fetus 1: Secondary | ICD-10-CM

## 2020-12-28 ENCOUNTER — Encounter: Payer: Self-pay | Admitting: *Deleted

## 2020-12-29 ENCOUNTER — Ambulatory Visit: Payer: Medicaid Other

## 2020-12-29 ENCOUNTER — Other Ambulatory Visit: Payer: Medicaid Other

## 2020-12-30 ENCOUNTER — Other Ambulatory Visit: Payer: Self-pay

## 2020-12-30 ENCOUNTER — Encounter: Payer: Self-pay | Admitting: *Deleted

## 2020-12-30 ENCOUNTER — Ambulatory Visit: Payer: Medicaid Other | Attending: Obstetrics & Gynecology

## 2020-12-30 ENCOUNTER — Other Ambulatory Visit: Payer: Self-pay | Admitting: Obstetrics & Gynecology

## 2020-12-30 ENCOUNTER — Ambulatory Visit: Payer: Medicaid Other | Admitting: *Deleted

## 2020-12-30 VITALS — BP 117/57 | HR 90

## 2020-12-30 DIAGNOSIS — Z98891 History of uterine scar from previous surgery: Secondary | ICD-10-CM

## 2020-12-30 DIAGNOSIS — Z363 Encounter for antenatal screening for malformations: Secondary | ICD-10-CM | POA: Diagnosis not present

## 2020-12-30 DIAGNOSIS — O34219 Maternal care for unspecified type scar from previous cesarean delivery: Secondary | ICD-10-CM

## 2020-12-30 DIAGNOSIS — Z3A27 27 weeks gestation of pregnancy: Secondary | ICD-10-CM | POA: Diagnosis not present

## 2020-12-30 DIAGNOSIS — O365921 Maternal care for other known or suspected poor fetal growth, second trimester, fetus 1: Secondary | ICD-10-CM | POA: Diagnosis present

## 2020-12-31 ENCOUNTER — Other Ambulatory Visit: Payer: Self-pay | Admitting: *Deleted

## 2020-12-31 DIAGNOSIS — O365920 Maternal care for other known or suspected poor fetal growth, second trimester, not applicable or unspecified: Secondary | ICD-10-CM

## 2021-01-14 ENCOUNTER — Other Ambulatory Visit: Payer: Self-pay | Admitting: *Deleted

## 2021-01-14 ENCOUNTER — Encounter: Payer: Self-pay | Admitting: *Deleted

## 2021-01-14 ENCOUNTER — Ambulatory Visit: Payer: Medicaid Other | Attending: Obstetrics

## 2021-01-14 ENCOUNTER — Ambulatory Visit (HOSPITAL_BASED_OUTPATIENT_CLINIC_OR_DEPARTMENT_OTHER): Payer: Medicaid Other | Admitting: *Deleted

## 2021-01-14 ENCOUNTER — Other Ambulatory Visit: Payer: Self-pay

## 2021-01-14 ENCOUNTER — Ambulatory Visit: Payer: Medicaid Other | Admitting: *Deleted

## 2021-01-14 VITALS — BP 109/59 | HR 93

## 2021-01-14 DIAGNOSIS — O365990 Maternal care for other known or suspected poor fetal growth, unspecified trimester, not applicable or unspecified: Secondary | ICD-10-CM

## 2021-01-14 DIAGNOSIS — M1630 Unilateral osteoarthritis resulting from hip dysplasia, unspecified hip: Secondary | ICD-10-CM | POA: Diagnosis not present

## 2021-01-14 DIAGNOSIS — O365930 Maternal care for other known or suspected poor fetal growth, third trimester, not applicable or unspecified: Secondary | ICD-10-CM

## 2021-01-14 DIAGNOSIS — O26893 Other specified pregnancy related conditions, third trimester: Secondary | ICD-10-CM

## 2021-01-14 DIAGNOSIS — O365920 Maternal care for other known or suspected poor fetal growth, second trimester, not applicable or unspecified: Secondary | ICD-10-CM | POA: Diagnosis not present

## 2021-01-14 DIAGNOSIS — O321XX0 Maternal care for breech presentation, not applicable or unspecified: Secondary | ICD-10-CM | POA: Diagnosis not present

## 2021-01-14 DIAGNOSIS — Z3A29 29 weeks gestation of pregnancy: Secondary | ICD-10-CM

## 2021-01-14 NOTE — Procedures (Signed)
Kaitlyn Robinson 12/09/1985 [redacted]w[redacted]d  Fetus A Non-Stress Test Interpretation for 01/14/21  Indication: FGR  Fetal Heart Rate A Mode: External Baseline Rate (A): 140 bpm Variability: Moderate Accelerations: 10 x 10 Decelerations: None Multiple birth?: No  Uterine Activity Mode: Palpation,Toco Contraction Frequency (min): none Resting Tone Palpated: Relaxed Resting Time: Adequate  Interpretation (Fetal Testing) Nonstress Test Interpretation: Reactive Overall Impression: Reassuring for gestational age Comments: Dr. Grace Bushy reviewed tracing.

## 2021-01-19 DIAGNOSIS — Z3A29 29 weeks gestation of pregnancy: Secondary | ICD-10-CM | POA: Diagnosis not present

## 2021-01-19 DIAGNOSIS — O365930 Maternal care for other known or suspected poor fetal growth, third trimester, not applicable or unspecified: Secondary | ICD-10-CM

## 2021-01-22 ENCOUNTER — Ambulatory Visit (HOSPITAL_BASED_OUTPATIENT_CLINIC_OR_DEPARTMENT_OTHER): Payer: Medicaid Other | Admitting: *Deleted

## 2021-01-22 ENCOUNTER — Other Ambulatory Visit: Payer: Self-pay

## 2021-01-22 ENCOUNTER — Encounter: Payer: Self-pay | Admitting: *Deleted

## 2021-01-22 ENCOUNTER — Ambulatory Visit: Payer: Medicaid Other | Admitting: *Deleted

## 2021-01-22 ENCOUNTER — Ambulatory Visit: Payer: Medicaid Other | Attending: Obstetrics

## 2021-01-22 VITALS — BP 108/68 | HR 86

## 2021-01-22 DIAGNOSIS — O365930 Maternal care for other known or suspected poor fetal growth, third trimester, not applicable or unspecified: Secondary | ICD-10-CM | POA: Insufficient documentation

## 2021-01-22 DIAGNOSIS — O365920 Maternal care for other known or suspected poor fetal growth, second trimester, not applicable or unspecified: Secondary | ICD-10-CM | POA: Diagnosis present

## 2021-01-22 DIAGNOSIS — M25859 Other specified joint disorders, unspecified hip: Secondary | ICD-10-CM | POA: Diagnosis not present

## 2021-01-22 DIAGNOSIS — Z362 Encounter for other antenatal screening follow-up: Secondary | ICD-10-CM | POA: Diagnosis not present

## 2021-01-22 DIAGNOSIS — O99891 Other specified diseases and conditions complicating pregnancy: Secondary | ICD-10-CM | POA: Diagnosis not present

## 2021-01-22 DIAGNOSIS — O321XX0 Maternal care for breech presentation, not applicable or unspecified: Secondary | ICD-10-CM

## 2021-01-22 DIAGNOSIS — Z3A30 30 weeks gestation of pregnancy: Secondary | ICD-10-CM

## 2021-01-22 DIAGNOSIS — O34219 Maternal care for unspecified type scar from previous cesarean delivery: Secondary | ICD-10-CM

## 2021-01-22 NOTE — Procedures (Signed)
Kaitlyn Robinson [birth year] [redacted]w[redacted]d  Fetus A Non-Stress Test Interpretation for 01/22/21  Indication: IUGR  Fetal Heart Rate A Mode: External Baseline Rate (A): 140 bpm Variability: Moderate Accelerations: 15 x 15 Decelerations: None Multiple birth?: No  Uterine Activity Mode: Palpation,Toco Contraction Frequency (min): none Resting Tone Palpated: Relaxed Resting Time: Adequate  Interpretation (Fetal Testing) Nonstress Test Interpretation: Reactive Overall Impression: Reassuring for gestational age Comments: Dr. Grace Bushy reviewed tracing.

## 2021-01-27 ENCOUNTER — Encounter: Payer: Self-pay | Admitting: *Deleted

## 2021-01-27 ENCOUNTER — Ambulatory Visit (HOSPITAL_BASED_OUTPATIENT_CLINIC_OR_DEPARTMENT_OTHER): Payer: Medicaid Other | Admitting: *Deleted

## 2021-01-27 ENCOUNTER — Ambulatory Visit: Payer: Medicaid Other | Admitting: *Deleted

## 2021-01-27 ENCOUNTER — Ambulatory Visit: Payer: Medicaid Other | Attending: Maternal & Fetal Medicine

## 2021-01-27 ENCOUNTER — Other Ambulatory Visit: Payer: Self-pay

## 2021-01-27 VITALS — BP 112/60 | HR 92

## 2021-01-27 DIAGNOSIS — O365990 Maternal care for other known or suspected poor fetal growth, unspecified trimester, not applicable or unspecified: Secondary | ICD-10-CM | POA: Diagnosis not present

## 2021-01-27 DIAGNOSIS — O365930 Maternal care for other known or suspected poor fetal growth, third trimester, not applicable or unspecified: Secondary | ICD-10-CM | POA: Diagnosis not present

## 2021-01-27 DIAGNOSIS — O34219 Maternal care for unspecified type scar from previous cesarean delivery: Secondary | ICD-10-CM | POA: Diagnosis not present

## 2021-01-27 DIAGNOSIS — O365931 Maternal care for other known or suspected poor fetal growth, third trimester, fetus 1: Secondary | ICD-10-CM

## 2021-01-27 DIAGNOSIS — Z3A31 31 weeks gestation of pregnancy: Secondary | ICD-10-CM

## 2021-01-27 DIAGNOSIS — O99891 Other specified diseases and conditions complicating pregnancy: Secondary | ICD-10-CM | POA: Diagnosis not present

## 2021-01-27 DIAGNOSIS — Q6589 Other specified congenital deformities of hip: Secondary | ICD-10-CM | POA: Diagnosis not present

## 2021-01-27 DIAGNOSIS — Z362 Encounter for other antenatal screening follow-up: Secondary | ICD-10-CM

## 2021-01-27 DIAGNOSIS — Z3A30 30 weeks gestation of pregnancy: Secondary | ICD-10-CM | POA: Diagnosis not present

## 2021-01-27 NOTE — Procedures (Signed)
Priscilla Kirstein [birth year] [redacted]w[redacted]d  Fetus A Non-Stress Test Interpretation for 01/27/21  Indication: FGR  Fetal Heart Rate A Mode: External Baseline Rate (A): 140 bpm Variability: Moderate Accelerations: 15 x 15 Decelerations: Variable Multiple birth?: No  Uterine Activity Mode: Palpation,Toco Contraction Frequency (min): ui Contraction Duration (sec): 10-20 Contraction Quality: Mild Resting Tone Palpated: Relaxed Resting Time: Adequate  Interpretation (Fetal Testing) Nonstress Test Interpretation: Reactive Overall Impression: Reassuring for gestational age Comments: Dr. Judeth Cornfield reviewed tracing.

## 2021-02-04 ENCOUNTER — Other Ambulatory Visit: Payer: Self-pay

## 2021-02-04 ENCOUNTER — Ambulatory Visit: Payer: Medicaid Other | Attending: Maternal & Fetal Medicine

## 2021-02-04 ENCOUNTER — Encounter: Payer: Self-pay | Admitting: *Deleted

## 2021-02-04 ENCOUNTER — Other Ambulatory Visit: Payer: Self-pay | Admitting: Obstetrics

## 2021-02-04 ENCOUNTER — Ambulatory Visit: Payer: Medicaid Other | Admitting: *Deleted

## 2021-02-04 VITALS — BP 113/64 | HR 92

## 2021-02-04 DIAGNOSIS — O99891 Other specified diseases and conditions complicating pregnancy: Secondary | ICD-10-CM | POA: Diagnosis not present

## 2021-02-04 DIAGNOSIS — O365990 Maternal care for other known or suspected poor fetal growth, unspecified trimester, not applicable or unspecified: Secondary | ICD-10-CM | POA: Insufficient documentation

## 2021-02-04 DIAGNOSIS — O365930 Maternal care for other known or suspected poor fetal growth, third trimester, not applicable or unspecified: Secondary | ICD-10-CM | POA: Diagnosis not present

## 2021-02-04 DIAGNOSIS — O34219 Maternal care for unspecified type scar from previous cesarean delivery: Secondary | ICD-10-CM

## 2021-02-04 DIAGNOSIS — Q6589 Other specified congenital deformities of hip: Secondary | ICD-10-CM

## 2021-02-04 DIAGNOSIS — Z3A32 32 weeks gestation of pregnancy: Secondary | ICD-10-CM

## 2021-02-11 ENCOUNTER — Ambulatory Visit: Payer: Medicaid Other | Admitting: *Deleted

## 2021-02-11 ENCOUNTER — Encounter: Payer: Self-pay | Admitting: *Deleted

## 2021-02-11 ENCOUNTER — Ambulatory Visit: Payer: Medicaid Other | Attending: Maternal & Fetal Medicine

## 2021-02-11 ENCOUNTER — Other Ambulatory Visit: Payer: Self-pay

## 2021-02-11 VITALS — BP 118/67 | HR 96

## 2021-02-11 DIAGNOSIS — Z362 Encounter for other antenatal screening follow-up: Secondary | ICD-10-CM

## 2021-02-11 DIAGNOSIS — O330 Maternal care for disproportion due to deformity of maternal pelvic bones: Secondary | ICD-10-CM | POA: Diagnosis not present

## 2021-02-11 DIAGNOSIS — Q6589 Other specified congenital deformities of hip: Secondary | ICD-10-CM

## 2021-02-11 DIAGNOSIS — O34219 Maternal care for unspecified type scar from previous cesarean delivery: Secondary | ICD-10-CM | POA: Diagnosis not present

## 2021-02-11 DIAGNOSIS — Z3A33 33 weeks gestation of pregnancy: Secondary | ICD-10-CM

## 2021-02-11 DIAGNOSIS — O365930 Maternal care for other known or suspected poor fetal growth, third trimester, not applicable or unspecified: Secondary | ICD-10-CM

## 2021-02-17 ENCOUNTER — Other Ambulatory Visit: Payer: Self-pay

## 2021-02-17 ENCOUNTER — Ambulatory Visit: Payer: Medicaid Other | Attending: Obstetrics

## 2021-02-17 ENCOUNTER — Encounter: Payer: Self-pay | Admitting: *Deleted

## 2021-02-17 ENCOUNTER — Ambulatory Visit: Payer: Medicaid Other | Admitting: *Deleted

## 2021-02-17 VITALS — BP 112/65 | HR 92

## 2021-02-17 DIAGNOSIS — O365930 Maternal care for other known or suspected poor fetal growth, third trimester, not applicable or unspecified: Secondary | ICD-10-CM | POA: Insufficient documentation

## 2021-02-17 DIAGNOSIS — O34219 Maternal care for unspecified type scar from previous cesarean delivery: Secondary | ICD-10-CM | POA: Diagnosis not present

## 2021-02-17 DIAGNOSIS — Z3A34 34 weeks gestation of pregnancy: Secondary | ICD-10-CM

## 2021-02-17 DIAGNOSIS — Q6589 Other specified congenital deformities of hip: Secondary | ICD-10-CM

## 2021-02-17 DIAGNOSIS — O99891 Other specified diseases and conditions complicating pregnancy: Secondary | ICD-10-CM | POA: Diagnosis not present

## 2021-02-18 ENCOUNTER — Other Ambulatory Visit: Payer: Self-pay | Admitting: Maternal & Fetal Medicine

## 2021-02-18 DIAGNOSIS — O365930 Maternal care for other known or suspected poor fetal growth, third trimester, not applicable or unspecified: Secondary | ICD-10-CM

## 2021-02-26 ENCOUNTER — Ambulatory Visit: Payer: Medicaid Other | Attending: Obstetrics

## 2021-02-26 ENCOUNTER — Ambulatory Visit: Payer: Medicaid Other | Admitting: *Deleted

## 2021-02-26 ENCOUNTER — Encounter: Payer: Self-pay | Admitting: *Deleted

## 2021-02-26 ENCOUNTER — Other Ambulatory Visit: Payer: Self-pay

## 2021-02-26 VITALS — BP 117/66 | HR 105

## 2021-02-26 DIAGNOSIS — Q6589 Other specified congenital deformities of hip: Secondary | ICD-10-CM

## 2021-02-26 DIAGNOSIS — O99891 Other specified diseases and conditions complicating pregnancy: Secondary | ICD-10-CM

## 2021-02-26 DIAGNOSIS — O34219 Maternal care for unspecified type scar from previous cesarean delivery: Secondary | ICD-10-CM

## 2021-02-26 DIAGNOSIS — Z3A35 35 weeks gestation of pregnancy: Secondary | ICD-10-CM

## 2021-02-26 DIAGNOSIS — O365930 Maternal care for other known or suspected poor fetal growth, third trimester, not applicable or unspecified: Secondary | ICD-10-CM | POA: Diagnosis present

## 2021-03-03 LAB — OB RESULTS CONSOLE GBS: GBS: NEGATIVE

## 2021-03-04 ENCOUNTER — Encounter: Payer: Self-pay | Admitting: *Deleted

## 2021-03-04 ENCOUNTER — Ambulatory Visit: Payer: Medicaid Other | Attending: Obstetrics

## 2021-03-04 ENCOUNTER — Other Ambulatory Visit: Payer: Self-pay

## 2021-03-04 ENCOUNTER — Ambulatory Visit: Payer: Medicaid Other | Admitting: *Deleted

## 2021-03-04 VITALS — BP 112/62 | HR 88

## 2021-03-04 DIAGNOSIS — Q6589 Other specified congenital deformities of hip: Secondary | ICD-10-CM | POA: Diagnosis not present

## 2021-03-04 DIAGNOSIS — O365930 Maternal care for other known or suspected poor fetal growth, third trimester, not applicable or unspecified: Secondary | ICD-10-CM

## 2021-03-04 DIAGNOSIS — Z3A36 36 weeks gestation of pregnancy: Secondary | ICD-10-CM

## 2021-03-04 DIAGNOSIS — O99891 Other specified diseases and conditions complicating pregnancy: Secondary | ICD-10-CM | POA: Diagnosis not present

## 2021-03-04 DIAGNOSIS — O34219 Maternal care for unspecified type scar from previous cesarean delivery: Secondary | ICD-10-CM

## 2021-03-11 ENCOUNTER — Encounter: Payer: Self-pay | Admitting: *Deleted

## 2021-03-11 ENCOUNTER — Ambulatory Visit: Payer: Medicaid Other | Attending: Maternal & Fetal Medicine

## 2021-03-11 ENCOUNTER — Other Ambulatory Visit: Payer: Self-pay

## 2021-03-11 ENCOUNTER — Ambulatory Visit: Payer: Medicaid Other | Admitting: *Deleted

## 2021-03-11 VITALS — BP 124/61 | HR 93

## 2021-03-11 DIAGNOSIS — O365930 Maternal care for other known or suspected poor fetal growth, third trimester, not applicable or unspecified: Secondary | ICD-10-CM | POA: Insufficient documentation

## 2021-03-17 ENCOUNTER — Encounter (HOSPITAL_COMMUNITY): Payer: Self-pay | Admitting: Obstetrics and Gynecology

## 2021-03-17 ENCOUNTER — Other Ambulatory Visit: Payer: Self-pay

## 2021-03-17 ENCOUNTER — Inpatient Hospital Stay (HOSPITAL_COMMUNITY): Payer: Medicaid Other | Admitting: Anesthesiology

## 2021-03-17 ENCOUNTER — Inpatient Hospital Stay (EMERGENCY_DEPARTMENT_HOSPITAL)
Admission: AD | Admit: 2021-03-17 | Discharge: 2021-03-17 | Disposition: A | Discharge status: Home / Self Care | Payer: Medicaid Other | Source: Home / Self Care | Attending: Obstetrics and Gynecology | Admitting: Obstetrics and Gynecology

## 2021-03-17 ENCOUNTER — Inpatient Hospital Stay (HOSPITAL_COMMUNITY)
Admission: AD | Admit: 2021-03-17 | Discharge: 2021-03-18 | DRG: 807 | Disposition: A | Discharge status: Home / Self Care | Payer: Medicaid Other | Attending: Obstetrics & Gynecology | Admitting: Obstetrics & Gynecology

## 2021-03-17 ENCOUNTER — Encounter (HOSPITAL_COMMUNITY): Payer: Self-pay | Admitting: Obstetrics & Gynecology

## 2021-03-17 DIAGNOSIS — O479 False labor, unspecified: Secondary | ICD-10-CM

## 2021-03-17 DIAGNOSIS — O26893 Other specified pregnancy related conditions, third trimester: Secondary | ICD-10-CM | POA: Diagnosis present

## 2021-03-17 DIAGNOSIS — O471 False labor at or after 37 completed weeks of gestation: Secondary | ICD-10-CM | POA: Insufficient documentation

## 2021-03-17 DIAGNOSIS — O34219 Maternal care for unspecified type scar from previous cesarean delivery: Principal | ICD-10-CM | POA: Diagnosis present

## 2021-03-17 DIAGNOSIS — Z20822 Contact with and (suspected) exposure to covid-19: Secondary | ICD-10-CM | POA: Diagnosis present

## 2021-03-17 DIAGNOSIS — Z3A38 38 weeks gestation of pregnancy: Secondary | ICD-10-CM

## 2021-03-17 LAB — CBC
HCT: 38.3 % (ref 36.0–46.0)
Hemoglobin: 12.5 g/dL (ref 12.0–15.0)
MCH: 28.2 pg (ref 26.0–34.0)
MCHC: 32.6 g/dL (ref 30.0–36.0)
MCV: 86.3 fL (ref 80.0–100.0)
Platelets: 188 10*3/uL (ref 150–400)
RBC: 4.44 MIL/uL (ref 3.87–5.11)
RDW: 13.1 % (ref 11.5–15.5)
WBC: 8.6 10*3/uL (ref 4.0–10.5)
nRBC: 0 % (ref 0.0–0.2)

## 2021-03-17 LAB — RPR: RPR Ser Ql: NONREACTIVE

## 2021-03-17 LAB — RESP PANEL BY RT-PCR (FLU A&B, COVID) ARPGX2
Influenza A by PCR: NEGATIVE
Influenza B by PCR: NEGATIVE
SARS Coronavirus 2 by RT PCR: NEGATIVE

## 2021-03-17 LAB — POCT FERN TEST: POCT Fern Test: POSITIVE

## 2021-03-17 LAB — TYPE AND SCREEN
ABO/RH(D): O POS
Antibody Screen: NEGATIVE

## 2021-03-17 MED ORDER — PHENYLEPHRINE 40 MCG/ML (10ML) SYRINGE FOR IV PUSH (FOR BLOOD PRESSURE SUPPORT): 80.0000 ug | PREFILLED_SYRINGE | INTRAVENOUS | Status: DC | PRN

## 2021-03-17 MED ORDER — EPHEDRINE 5 MG/ML INJ: 10.0000 mg | INTRAVENOUS | Status: DC | PRN

## 2021-03-17 MED ORDER — FENTANYL-BUPIVACAINE-NACL 0.5-0.125-0.9 MG/250ML-% EP SOLN
12.0000 mL/h | EPIDURAL | Status: DC | PRN
  Filled 2021-03-17: qty 250

## 2021-03-17 MED ORDER — TERBUTALINE SULFATE 1 MG/ML IJ SOLN: 0.2500 mg | Freq: Once | INTRAMUSCULAR | Status: DC | PRN

## 2021-03-17 MED ORDER — IBUPROFEN 600 MG PO TABS
600.0000 mg | ORAL_TABLET | Freq: Four times a day (QID) | ORAL | Status: DC
  Administered 2021-03-17 – 2021-03-18 (×4): 600 mg via ORAL
  Filled 2021-03-17 (×4): qty 1

## 2021-03-17 MED ORDER — SENNOSIDES-DOCUSATE SODIUM 8.6-50 MG PO TABS
2.0000 | ORAL_TABLET | Freq: Every day | ORAL | Status: DC
  Administered 2021-03-18: 2 via ORAL
  Filled 2021-03-17: qty 2

## 2021-03-17 MED ORDER — COCONUT OIL OIL: 1.0000 "application " | TOPICAL_OIL | Status: DC | PRN

## 2021-03-17 MED ORDER — LACTATED RINGERS IV SOLN: INTRAVENOUS | Status: DC

## 2021-03-17 MED ORDER — OXYTOCIN-SODIUM CHLORIDE 30-0.9 UT/500ML-% IV SOLN
1.0000 m[IU]/min | INTRAVENOUS | Status: DC
  Administered 2021-03-17: 1 m[IU]/min via INTRAVENOUS
  Filled 2021-03-17: qty 500

## 2021-03-17 MED ORDER — FLEET ENEMA 7-19 GM/118ML RE ENEM: 1.0000 | ENEMA | RECTAL | Status: DC | PRN

## 2021-03-17 MED ORDER — ONDANSETRON HCL 4 MG PO TABS: 4.0000 mg | ORAL_TABLET | ORAL | Status: DC | PRN

## 2021-03-17 MED ORDER — LIDOCAINE HCL (PF) 1 % IJ SOLN
INTRAMUSCULAR | Status: DC | PRN
  Administered 2021-03-17: 2 mL via EPIDURAL
  Administered 2021-03-17: 10 mL via EPIDURAL

## 2021-03-17 MED ORDER — ZOLPIDEM TARTRATE 5 MG PO TABS: 5.0000 mg | ORAL_TABLET | Freq: Every evening | ORAL | Status: DC | PRN

## 2021-03-17 MED ORDER — FENTANYL-BUPIVACAINE-NACL 0.5-0.125-0.9 MG/250ML-% EP SOLN
EPIDURAL | Status: DC | PRN
  Administered 2021-03-17: 12 mL/h via EPIDURAL

## 2021-03-17 MED ORDER — OXYCODONE HCL 5 MG PO TABS: 10.0000 mg | ORAL_TABLET | ORAL | Status: DC | PRN

## 2021-03-17 MED ORDER — DIPHENHYDRAMINE HCL 50 MG/ML IJ SOLN: 12.5000 mg | INTRAMUSCULAR | Status: DC | PRN

## 2021-03-17 MED ORDER — MAGNESIUM HYDROXIDE 400 MG/5ML PO SUSP: 30.0000 mL | ORAL | Status: DC | PRN

## 2021-03-17 MED ORDER — OXYTOCIN-SODIUM CHLORIDE 30-0.9 UT/500ML-% IV SOLN: 2.5000 [IU]/h | INTRAVENOUS | Status: DC

## 2021-03-17 MED ORDER — LACTATED RINGERS IV SOLN: 500.0000 mL | Freq: Once | INTRAVENOUS | Status: DC

## 2021-03-17 MED ORDER — OXYCODONE HCL 5 MG PO TABS: 5.0000 mg | ORAL_TABLET | ORAL | Status: DC | PRN

## 2021-03-17 MED ORDER — LACTATED RINGERS IV SOLN: 500.0000 mL | INTRAVENOUS | Status: DC | PRN

## 2021-03-17 MED ORDER — PRENATAL MULTIVITAMIN CH
1.0000 | ORAL_TABLET | Freq: Every day | ORAL | Status: DC
  Administered 2021-03-18: 1 via ORAL
  Filled 2021-03-17: qty 1

## 2021-03-17 MED ORDER — DIBUCAINE (PERIANAL) 1 % EX OINT: 1.0000 "application " | TOPICAL_OINTMENT | CUTANEOUS | Status: DC | PRN

## 2021-03-17 MED ORDER — ACETAMINOPHEN 325 MG PO TABS: 650.0000 mg | ORAL_TABLET | ORAL | Status: DC | PRN

## 2021-03-17 MED ORDER — SOD CITRATE-CITRIC ACID 500-334 MG/5ML PO SOLN: 30.0000 mL | ORAL | Status: DC | PRN

## 2021-03-17 MED ORDER — ACETAMINOPHEN 325 MG PO TABS
650.0000 mg | ORAL_TABLET | ORAL | Status: DC | PRN
Start: 2021-03-17 — End: 2021-03-18
  Administered 2021-03-18: 650 mg via ORAL
  Filled 2021-03-17: qty 2

## 2021-03-17 MED ORDER — OXYCODONE-ACETAMINOPHEN 5-325 MG PO TABS
2.0000 | ORAL_TABLET | ORAL | Status: DC | PRN
Start: 2021-03-17 — End: 2021-03-17

## 2021-03-17 MED ORDER — LIDOCAINE HCL (PF) 1 % IJ SOLN: 30.0000 mL | INTRAMUSCULAR | Status: DC | PRN

## 2021-03-17 MED ORDER — OXYCODONE-ACETAMINOPHEN 5-325 MG PO TABS
1.0000 | ORAL_TABLET | ORAL | Status: DC | PRN
Start: 2021-03-17 — End: 2021-03-17

## 2021-03-17 MED ORDER — ONDANSETRON HCL 4 MG/2ML IJ SOLN: 4.0000 mg | INTRAMUSCULAR | Status: DC | PRN

## 2021-03-17 MED ORDER — BENZOCAINE-MENTHOL 20-0.5 % EX AERO
1.0000 "application " | INHALATION_SPRAY | CUTANEOUS | Status: DC | PRN
  Administered 2021-03-17: 1 via TOPICAL
  Filled 2021-03-17: qty 56

## 2021-03-17 MED ORDER — DIPHENHYDRAMINE HCL 25 MG PO CAPS: 25.0000 mg | ORAL_CAPSULE | Freq: Four times a day (QID) | ORAL | Status: DC | PRN

## 2021-03-17 MED ORDER — WITCH HAZEL-GLYCERIN EX PADS: 1.0000 "application " | MEDICATED_PAD | CUTANEOUS | Status: DC | PRN

## 2021-03-17 MED ORDER — SIMETHICONE 80 MG PO CHEW: 80.0000 mg | CHEWABLE_TABLET | ORAL | Status: DC | PRN

## 2021-03-17 MED ORDER — FENTANYL CITRATE (PF) 100 MCG/2ML IJ SOLN
50.0000 ug | INTRAMUSCULAR | Status: DC | PRN
  Administered 2021-03-17: 50 ug via INTRAVENOUS
  Filled 2021-03-17: qty 2

## 2021-03-17 MED ORDER — OXYTOCIN BOLUS FROM INFUSION
333.0000 mL | Freq: Once | INTRAVENOUS | Status: AC
  Administered 2021-03-17: 333 mL via INTRAVENOUS

## 2021-03-17 MED ORDER — ONDANSETRON HCL 4 MG/2ML IJ SOLN: 4.0000 mg | Freq: Four times a day (QID) | INTRAMUSCULAR | Status: DC | PRN

## 2021-03-17 NOTE — MAU Note (Signed)
Srom clear fluid at 0715, still coming. Still contracting, states feels stronger. No bleeding. +FM reported.  Pt for TOLAC, first c/s for breech presentation.

## 2021-03-17 NOTE — Anesthesia Preprocedure Evaluation (Signed)
Anesthesia Evaluation  Patient identified by MRN, date of birth, ID band Patient awake    Reviewed: Allergy & Precautions, Patient's Chart, lab work & pertinent test results  Airway Mallampati: II  TM Distance: >3 FB Neck ROM: Full    Dental no notable dental hx.    Pulmonary neg pulmonary ROS,    Pulmonary exam normal breath sounds clear to auscultation       Cardiovascular negative cardio ROS Normal cardiovascular exam Rhythm:Regular Rate:Normal     Neuro/Psych negative neurological ROS  negative psych ROS   GI/Hepatic Neg liver ROS, GERD  Medicated and Controlled,  Endo/Other  negative endocrine ROS  Renal/GU negative Renal ROS  negative genitourinary   Musculoskeletal negative musculoskeletal ROS (+)   Abdominal   Peds  Hematology negative hematology ROS (+) hct 38.3, plt 188   Anesthesia Other Findings   Reproductive/Obstetrics (+) Pregnancy TOLAC                             Anesthesia Physical Anesthesia Plan  ASA: 2 and emergent  Anesthesia Plan: Epidural   Post-op Pain Management:    Induction:   PONV Risk Score and Plan: 2  Airway Management Planned: Natural Airway  Additional Equipment: None  Intra-op Plan:   Post-operative Plan:   Informed Consent: I have reviewed the patients History and Physical, chart, labs and discussed the procedure including the risks, benefits and alternatives for the proposed anesthesia with the patient or authorized representative who has indicated his/her understanding and acceptance.       Plan Discussed with:   Anesthesia Plan Comments:         Anesthesia Quick Evaluation

## 2021-03-17 NOTE — Lactation Note (Addendum)
This note was copied from a baby's chart. Lactation Consultation Note  Patient Name: Kaitlyn Robinson'S Date: 03/17/2021 Reason for consult: Initial assessment;Early term 37-38.6wks;Infant < 6lbs Age:34 hours Per mom, infant been latching well, BF for 10 minutes in L&D and on MBU BF for 15 minutes at 2130 pm and afterwards  infant was given 5 mls of colostrum by spoon. Mom knows how to hand express and mom expressed 5 mls of colostrum she will offer to infant after latching infant at the breast for the next feeding. Mom prefers to offer her EBM tonight but she is open to supplementing infant with donor breast milk if needed. Mom latched infant on her right breast using the cradle hold position infant was off and on but started sustaining latch, still BF after 5 minutes when LC left the room. Mom knows to breastfeed infant according to hunger cues, 8 to 12+ or more times within 24 hours, STS. LC discussed infant's input and output with parents. LC discussed importance of doing STS, with infant. Mom knows to call RN or LC for latch assistance if needed. Mom made aware of O/P services, breastfeeding support groups, community resources, and our phone # for post-discharge questions.   Mom's plan: 1- BF infant according to cues, 8 to 12+ times within 24 hours, STS. 2- Afterwards mom will supplement infant with her EBM that she hand express or from hand pump by spoon. 3- Mom is open to using donor breast milk if needed, but wants to use her own EBM tonight. 4- Mom knows to call RN or LC if she needs assistance with latching infant at the breast.  Maternal Data Has patient been taught Hand Expression?: Yes Does the patient have breastfeeding experience prior to this delivery?: Yes How long did the patient breastfeed?: Per mom, she excussively BF her 42 year old son for 18 months  Feeding Mother's Current Feeding Choice: Breast Milk  LATCH Score Latch: Repeated attempts needed to sustain latch,  nipple held in mouth throughout feeding, stimulation needed to elicit sucking reflex.  Audible Swallowing: A few with stimulation  Type of Nipple: Everted at rest and after stimulation  Comfort (Breast/Nipple): Soft / non-tender  Hold (Positioning): Assistance needed to correctly position infant at breast and maintain latch.  LATCH Score: 7   Lactation Tools Discussed/Used Tools: Pump Breast pump type: Manual Pump Education: Setup, frequency, and cleaning Reason for Pumping: Mom plans to pump after latching infant at the breast, mom fitted with 21 mm breast flange. Pumping frequency: Mom will pump after  latching infant at breast with hand pump today.  Interventions Interventions: Breast feeding basics reviewed;Assisted with latch;Skin to skin;Hand express;Breast compression;Adjust position;Support pillows;Position options;Expressed milk;Hand pump;Education  Discharge Pump: Manual;Personal (Per mom, she has medela DEBP at home.)  Consult Status Consult Status: Follow-up Date: 03/18/21 Follow-up type: In-patient    Danelle Earthly 03/17/2021, 10:28 PM

## 2021-03-17 NOTE — H&P (Addendum)
Kaitlyn Robinson is a 35 y.o. female presenting for spontaneous rupture of membranes at 07:15 AM today, pink tinged fluid.  She also has contractions.  She reports normal fetal movement.  Prenatal course was significant for intrauterine growth restriction in the second trimester which resolved by the third trimester. Last ultrasound: 03/04/2021: EFW 6lb (29th%), AC 78th%.   OB History     Gravida  2   Para  1   Term  1   Preterm      AB      Living  1      SAB      IAB      Ectopic      Multiple  0   Live Births  1          Past Medical History:  Diagnosis Date   Medical history non-contributory    Meningitis    Past Surgical History:  Procedure Laterality Date   CESAREAN SECTION N/A 01/09/2018   Procedure: PRIMARY CESAREAN SECTION;  Surgeon: Catalina Antigua, MD;  Location: [facility];  Service: Obstetrics;  Laterality: N/A;   NO PAST SURGERIES     Family History: family history includes Colon cancer in her mother; Gout in her father; Seizures in her brother. Social History:  reports that she has never smoked. She has never used smokeless tobacco. She reports that she does not drink alcohol and does not use drugs.     Maternal Diabetes: No Genetic Screening: Normal Maternal Ultrasounds/Referrals: Isolated EIF (echogenic intracardiac focus) Fetal Ultrasounds or other Referrals:  None Maternal Substance Abuse:  No Significant Maternal Medications:  None Significant Maternal Lab Results:  Group B Strep negative and Other:  Other Comments:   Antibody screen was positive in first trimester.  Recheck in second trimester was negative.   Review of Systems ROS  Constitutional: Denies fevers/chills Cardiovascular: Denies chest pain or palpitations Pulmonary: Denies coughing or wheezing Gastrointestinal: Denies nausea, vomiting or diarrhea Genitourinary: With contractions. With blood tinged amniotic fluid.  She denies unusual vaginal bleeding, unusual vaginal  discharge, dysuria, urgency or frequency.  Musculoskeletal: Denies muscle or joint aches and pain.  Neurology: Denies abnormal sensations such as tingling or numbness.   History Dilation: 3 Effacement (%): 70 Station: -2 Exam by:: Dr. Sallye Ober Blood pressure 115/66, pulse 79, temperature 97.9 F (36.6 C), temperature source Oral, resp. rate 18, height 5' (1.524 m), weight 65.2 kg, last menstrual period 06/21/2020, SpO2 100 %, BMI 28. NST: Baseline 130, moderate variability, reactive. TOCO: Irregular contractions every 2 to 10 minutes.  Exam Physical Exam  Constitutional: She is oriented to person, place, and time. She appears well-developed and well-nourished. She is uncomfortable with contractions.  HENT:  Head: Normocephalic and atraumatic.  Neck: Normal range of motion.  Cardiovascular: Normal rate.   Respiratory: Effort normal. GI: Soft. Bowel sounds are normal.  Neurological: She is alert and oriented to person, place, and time.  Skin: Skin is warm and dry.  Psychiatric: She has a normal mood and affect. Her behavior is normal.   EFW: By leopold's 6.5 lbs, adequate pelvis.  Prenatal labs: ABO, Rh: --/--/O POS (06/15 4917) Antibody: NEG (06/15 0806) Rubella: Immune (12/07 0000) RPR: Nonreactive (12/07 0000)  HBsAg: Negative (12/07 0000)  HIV: Non-reactive (12/07 0000)  GBS: Negative/-- (06/01 0000)   Recent Results (from the past 2160 hour(s))  OB RESULT CONSOLE Group B Strep     Status: None   Collection Time: 03/03/21 12:00 AM  Result Value Ref Range  GBS Negative   CBC     Status: None   Collection Time: 03/17/21  8:06 AM  Result Value Ref Range   WBC 8.6 4.0 - 10.5 K/uL   RBC 4.44 3.87 - 5.11 MIL/uL   Hemoglobin 12.5 12.0 - 15.0 g/dL   HCT 19.4 17.4 - 08.1 %   MCV 86.3 80.0 - 100.0 fL   MCH 28.2 26.0 - 34.0 pg   MCHC 32.6 30.0 - 36.0 g/dL   RDW 44.8 18.5 - 63.1 %   Platelets 188 150 - 400 K/uL   nRBC 0.0 0.0 - 0.2 %    Comment: Performed at [facility]  Lab, [address], [place], [place] [address]  Type and screen [facility]     Status: None   Collection Time: 03/17/21  8:06 AM  Result Value Ref Range   ABO/RH(D) O POS    Antibody Screen NEG    Sample Expiration      03/20/2021,2359 Performed at [facility], [address] [place], [place] [address]   POCT fern test     Status: None   Collection Time: 03/17/21  8:08 AM  Result Value Ref Range   POCT Fern Test Positive = ruptured amniotic membanes   Resp Panel by RT-PCR (Flu A&B, Covid) Nasopharyngeal Swab     Status: None   Collection Time: 03/17/21  8:10 AM   Specimen: Nasopharyngeal Swab; Nasopharyngeal(NP) swabs in vial transport medium  Result Value Ref Range   SARS Coronavirus 2 by RT PCR NEGATIVE NEGATIVE    Comment: (NOTE) SARS-CoV-2 target nucleic acids are NOT DETECTED.  The SARS-CoV-2 RNA is generally detectable in upper respiratory specimens during the acute phase of infection. The lowest concentration of SARS-CoV-2 viral copies this assay can detect is 138 copies/mL. A negative result does not preclude SARS-Cov-2 infection and should not be used as the sole basis for treatment or other patient management decisions. A negative result may occur with  improper specimen collection/handling, submission of specimen other than nasopharyngeal swab, presence of viral mutation(s) within the areas targeted by this assay, and inadequate number of viral copies(<138 copies/mL). A negative result must be combined with clinical observations, patient history, and epidemiological information. The expected result is Negative.  Fact Sheet for Patients:  BloggerCourse.com  Fact Sheet for Healthcare Providers:  SeriousBroker.it  This test is no t yet approved or cleared by the [place] FDA and  has been authorized for detection and/or diagnosis of SARS-CoV-2 by FDA under an Emergency Use Authorization  (EUA). This EUA will remain  in effect (meaning this test can be used) for the duration of the COVID-19 declaration under Section 564(b)(1) of the Act, 21 U.S.C.section 360bbb-3(b)(1), unless the authorization is terminated  or revoked sooner.       Influenza A by PCR NEGATIVE NEGATIVE   Influenza B by PCR NEGATIVE NEGATIVE    Comment: (NOTE) The Xpert Xpress SARS-CoV-2/FLU/RSV plus assay is intended as an aid in the diagnosis of influenza from Nasopharyngeal swab specimens and should not be used as a sole basis for treatment. Nasal washings and aspirates are unacceptable for Xpert Xpress SARS-CoV-2/FLU/RSV testing.  Fact Sheet for Patients: BloggerCourse.com  Fact Sheet for Healthcare Providers: SeriousBroker.it  This test is not yet approved or cleared by the [place] FDA and has been authorized for detection and/or diagnosis of SARS-CoV-2 by FDA under an Emergency Use Authorization (EUA). This EUA will remain in effect (meaning this test can  be used) for the duration of the COVID-19 declaration under Section 564(b)(1) of the Act, 21 U.S.C. section 360bbb-3(b)(1), unless the authorization is terminated or revoked.  Performed at [facility], [address], [place], [place] [address]     Assessment/Plan: 35 y/o G2P1001 at 38 weeks 3 days EGA with spontaneous rupture of membranes and in early labor, with some cervical change, doing a trial of labor after a cesarean section. - Admit to labor and delivery. - Pain medication as desired, she has received one dose IV fentanyl and may use more of same or nitrous oxide or epidural as needed.  - Labor management options discussed.  She desires to eat now.  Will reassess cervix in about 3 hours and if unchanged or with irregular contractions will start pitocin for labor augmentation. I discussed with patient risks, benefits and alternatives of pitocin used for  laboraugmentation including effects on fetal heart beat, contraction pattern and need for close monitoring.  Patient expressed understanding of all this and will desire to proceed with augmentation if unchanged cervix or irregular contractions.    - Patient has already signed the trial of labor consent form in the office on 03/10/2021.  She understands risk of uterine rupture which is associated with trial of labor after a cesarean section and its accompanying comorbidities to her and the baby.  Prescilla Sours, MD. 03/17/2021, 11:04 AM

## 2021-03-17 NOTE — Anesthesia Procedure Notes (Signed)
Epidural Patient location during procedure: OB Start time: 03/17/2021 12:11 PM End time: 03/17/2021 12:19 PM  Staffing Anesthesiologist: Lannie Fields, DO Performed: anesthesiologist   Preanesthetic Checklist Completed: patient identified, IV checked, risks and benefits discussed, monitors and equipment checked, pre-op evaluation and timeout performed  Epidural Patient position: sitting Prep: DuraPrep and site prepped and draped Patient monitoring: continuous pulse ox, blood pressure, heart rate and cardiac monitor Approach: midline Location: L3-L4 Injection technique: LOR air  Needle:  Needle type: Tuohy  Needle gauge: 17 G Needle length: 9 cm Needle insertion depth: 6 cm Catheter type: closed end flexible Catheter size: 19 Gauge Catheter at skin depth: 11 cm Test dose: negative  Assessment Sensory level: T8 Events: blood not aspirated, injection not painful, no injection resistance, no paresthesia and negative IV test  Additional Notes Patient identified. Risks/Benefits/Options discussed with patient including but not limited to bleeding, infection, nerve damage, paralysis, failed block, incomplete pain control, headache, blood pressure changes, nausea, vomiting, reactions to medication both or allergic, itching and postpartum back pain. Confirmed with bedside nurse the patient's most recent platelet count. Confirmed with patient that they are not currently taking any anticoagulation, have any bleeding history or any family history of bleeding disorders. Patient expressed understanding and wished to proceed. All questions were answered. Sterile technique was used throughout the entire procedure. Please see nursing notes for vital signs. Test dose was given through epidural catheter and negative prior to continuing to dose epidural or start infusion. Warning signs of high block given to the patient including shortness of breath, tingling/numbness in hands, complete motor  block, or any concerning symptoms with instructions to call for help. Patient was given instructions on fall risk and not to get out of bed. All questions and concerns addressed with instructions to call with any issues or inadequate analgesia.  Reason for block:procedure for pain

## 2021-03-17 NOTE — MAU Note (Signed)
PT SAYS UC STRONG SINCE 2300.  PNC WITH  DR ROBERTS VE YESTERDAY-  1 CM  GBS- NEG  DENIES HSV

## 2021-03-17 NOTE — MAU Provider Note (Signed)
S: Ms. Taunia Frasco is a 35 y.o. G2P1001 at [redacted]w[redacted]d  who presents to MAU today for labor evaluation.     Cervical exam by RN:  Dilation: 1.5 Effacement (%): 60 Cervical Position: Posterior Station: -2 Presentation: Vertex Exam by:: DCALLAWAY, RN  Fetal Monitoring: Baseline: 140 Variability: avge Accelerations: present Decelerations: absent Contractions: occasional   MDM Discussed patient with RN. NST reviewed.   A: SIUP at [redacted]w[redacted]d  False labor  P: Discharge home Labor precautions and kick counts included in AVS Patient to follow-up with office as scheduled  Patient may return to MAU as needed or when in labor   Valora Piccolo 03/17/2021 4:33 AM

## 2021-03-17 NOTE — MAU Note (Signed)
Large puddle in chair, dripping down legs and onto floor when stood, pink clear fluid.

## 2021-03-18 ENCOUNTER — Ambulatory Visit: Payer: Medicaid Other

## 2021-03-18 LAB — CBC
HCT: 31.8 % — ABNORMAL LOW (ref 36.0–46.0)
Hemoglobin: 10.6 g/dL — ABNORMAL LOW (ref 12.0–15.0)
MCH: 28.3 pg (ref 26.0–34.0)
MCHC: 33.3 g/dL (ref 30.0–36.0)
MCV: 84.8 fL (ref 80.0–100.0)
Platelets: 154 10*3/uL (ref 150–400)
RBC: 3.75 MIL/uL — ABNORMAL LOW (ref 3.87–5.11)
RDW: 13 % (ref 11.5–15.5)
WBC: 12.4 10*3/uL — ABNORMAL HIGH (ref 4.0–10.5)
nRBC: 0 % (ref 0.0–0.2)

## 2021-03-18 MED ORDER — IBUPROFEN 600 MG PO TABS: 600.0000 mg | ORAL_TABLET | Freq: Four times a day (QID) | ORAL | 0 refills | Status: AC

## 2021-03-18 NOTE — Anesthesia Postprocedure Evaluation (Signed)
Anesthesia Post Note  Patient: Kaitlyn Robinson  Procedure(s) Performed: AN AD HOC LABOR EPIDURAL     Patient location during evaluation: Mother Baby Anesthesia Type: Epidural Level of consciousness: awake, awake and alert and oriented Pain management: pain level controlled Vital Signs Assessment: post-procedure vital signs reviewed and stable Respiratory status: spontaneous breathing and respiratory function stable Cardiovascular status: blood pressure returned to baseline Postop Assessment: no headache, epidural receding, patient able to bend at knees, adequate PO intake, no backache, no apparent nausea or vomiting and able to ambulate Anesthetic complications: no   No notable events documented.  Last Vitals:  Vitals:   03/17/21 2314 03/18/21 0315  BP: 108/66 (!) 97/53  Pulse: 95 73  Resp: 18 16  Temp: 36.8 C 36.7 C  SpO2: 98% 99%    Last Pain:  Vitals:   03/18/21 0730  TempSrc:   PainSc: 0-No pain   Pain Goal:                   Cleda Clarks

## 2021-03-18 NOTE — Lactation Note (Addendum)
This note was copied from a baby's chart. Lactation Consultation Note  Patient Name: Kaitlyn Robinson [id number] Date: 03/18/2021 Reason for consult: Follow-up assessment;Mother's request;Difficult latch;Early term 37-38.6wks;Infant < 6lbs Age:34 hours  Infant latched shallow in cradle on arrival. Mom denied any pain with the latch. LC assisted getting a deeper latch with a chin tug and breast compression in football and cross cradle. Infant tend to pop and off the breast. Infant has high palate tends to bunch up tongue. LC did some suck training and infant able to extend the tongue pass the gum line.   Mom set up on DEBP with 21 flange, she states is a comfortable fit.  Given infant less than 6 pounds, LC did review how to prevent calorie loss keeping infant STS, hand on all times and feeding 8-12x in 24 no more than 3 without an attempt. Mom also to keep total feeding under 30 minutes.   Plan 1. Feed based on cues as stated above no more than 3 hrs without an attempt. Mom to offer both breasts, STS and look for signs of milk transfer.          2. Mom to supplement , after latching , 5-7 ml with use of spoon or curve tip with finger feeding. Mom to call [facility] or RN for assistance if needed.         3. Mom to pump with DEBP q 3 hrs for 15 minutes         4 I and O sheet reviewed.          5 LC brochure of inpatient and outpatient services reviewed.  All questions answered at the end of the visit.   LC alerted RN, Nila Nephew, with plan listed above to assist Mom with first use of curve tip and finger feeding for next feeding.     Maternal Data Has patient been taught Hand Expression?: Yes  Feeding Mother's Current Feeding Choice: Breast Milk  LATCH Score Latch: Repeated attempts needed to sustain latch, nipple held in mouth throughout feeding, stimulation needed to elicit sucking reflex.  Audible Swallowing: A few with stimulation  Type of Nipple: Everted at rest and after  stimulation  Comfort (Breast/Nipple): Soft / non-tender  Hold (Positioning): Assistance needed to correctly position infant at breast and maintain latch.  LATCH Score: 7   Lactation Tools Discussed/Used Tools: Pump;Flanges Flange Size: 21 Breast pump type: Double-Electric Breast Pump Pump Education: Setup, frequency, and cleaning;Milk Storage Reason for Pumping: increase stimulation Pumping frequency: every 3 hrs for 15 minutes  Interventions Interventions: Breast feeding basics reviewed;Support pillows;Education;Assisted with latch;Position options;Skin to skin;Expressed milk;Breast massage;Hand express;DEBP;Breast compression;Adjust position  Discharge Pump: Personal  Consult Status Consult Status: Follow-up Date: 03/19/21 Follow-up type: In-patient    Karilynn Carranza  Nicholson-Springer 03/18/2021, 12:36 PM

## 2021-03-18 NOTE — Discharge Summary (Signed)
VBAC OB Discharge Summary     Patient Name: Kaitlyn Robinson DOB: 04/16/1986 MRN: [id number]  Date of admission: 03/17/2021 Delivering MD: Hoover Browns  Date of delivery: 03/17/2021 Type of delivery: VBAC  Newborn Data: Sex: Baby female Live born female  Birth Weight: 5 lb 13.3 oz (2645 g) APGAR: 9, 9  Newborn Delivery   Birth date/time: 03/17/2021 15:57:00 Delivery type: VBAC, Spontaneous      Feeding: breast Infant being discharge to home with mother in stable condition.   Admitting diagnosis: Normal labor and delivery [O80] VBAC (vaginal birth after Cesarean) [O34.219] Intrauterine pregnancy: [redacted]w[redacted]d     Secondary diagnosis:  Active Problems:   Normal labor and delivery   VBAC (vaginal birth after Cesarean)   Normal postpartum course                                Complications: None                                                              Intrapartum Procedures: spontaneous vaginal delivery Postpartum Procedures: none Complications-Operative and Postpartum:  1st degree perineal laceration Augmentation: Pitocin   History of Present Illness: Ms. Kaitlyn Robinson is a 35 y.o. female, G2P2002, who presents at [redacted]w[redacted]d weeks gestation. The patient has been followed at  [facility]  Her pregnancy has been complicated by:  Patient Active Problem List   Diagnosis Date Noted   Normal postpartum course 03/18/2021   Normal labor and delivery 03/17/2021   VBAC (vaginal birth after Cesarean) 03/17/2021     Active Ambulatory Problems    Diagnosis Date Noted   No Active Ambulatory Problems   Resolved Ambulatory Problems    Diagnosis Date Noted   Supervision of normal first pregnancy 07/03/2017   Vaginal discharge during pregnancy 08/28/2017   GERD (gastroesophageal reflux disease) 08/28/2017   Influenza A 11/16/2017   Positive GBS test 12/20/2017   Malpresentation of fetus, antepartum 12/25/2017   Breech presentation of fetus 12/28/2017   Status  post primary low transverse cesarean section 01/09/2018   Past Medical History:  Diagnosis Date   Medical history non-contributory    Meningitis      Hospital course:  Onset of Labor With Vaginal Delivery      35 y.o. yo [id number] at [redacted]w[redacted]d was admitted in Latent Labor on 03/17/2021. Patient had an uncomplicated labor course as follows:  Membrane Rupture Time/Date: 7:15 AM ,03/17/2021   Delivery Method:VBAC, Spontaneous  Episiotomy: None  Lacerations:  1st degree;Perineal  Patient had an uncomplicated postpartum course.  She is ambulating, tolerating a regular diet, passing flatus, and urinating well. Patient is discharged home in stable condition on 03/18/21.  Newborn Data: Birth date:03/17/2021  Birth time:3:57 PM  Gender:Female  Living status:Living  Apgars:9 ,9  Weight:2645 g  Postpartum Day # 1 : S/P NSVD due to admitted to on 6/15 at 38.3 in latent labor progressed with pitocin to VBAC on 6/15@ 1557 over 1st degree repair, with ebl of , hgb drop of 12.5-10.6. Patient up ad lib, denies syncope or dizziness. Reports consuming regular diet without issues and denies N/V. Patient reports 0 bowel movement + passing flatus.  Denies issues with urination and reports  bleeding is "lighter."  Patient is breastfeeding and reports going well.  Desires min pill for postpartum contraception.  Pain is being appropriately managed with use of po meds.   Physical exam  Vitals:   03/17/21 1755 03/17/21 1911 03/17/21 2314 03/18/21 0315  BP: 102/62 102/65 108/66 (!) 97/53  Pulse: 84 87 95 73  Resp: 18 16 18 16   Temp: 98.3 F (36.8 C) 98.2 F (36.8 C) 98.2 F (36.8 C) 98 F (36.7 C)  TempSrc: Oral Oral Oral Oral  SpO2: 100% 97% 98% 99%  Weight:      Height:       General: alert, cooperative, and no distress Lochia: appropriate Uterine Fundus: firm Perineum: approximate DVT Evaluation: No evidence of DVT seen on physical exam. Negative Homan's sign. No cords or calf tenderness. No  significant calf/ankle edema.  Labs: Lab Results  Component Value Date   WBC 12.4 (H) 03/18/2021   HGB 10.6 (L) 03/18/2021   HCT 31.8 (L) 03/18/2021   MCV 84.8 03/18/2021   PLT 154 03/18/2021   CMP Latest Ref Rng & Units 01/09/2018  Glucose 70 - 99 mg/dL -  BUN 6 - 23 mg/dL -  Creatinine 03/11/2018 - 1.91 mg/dL 6.60  Sodium 6.00 - 459 mEq/L -  Potassium 3.5 - 5.1 mEq/L -  Chloride 96 - 112 mEq/L -  CO2 19 - 32 mEq/L -  Calcium 8.4 - 10.5 mg/dL -  Total Protein 6.0 - 8.3 g/dL -  Total Bilirubin 0.3 - 1.2 mg/dL -  Alkaline Phos 39 - 977 U/L -  AST 0 - 37 U/L -  ALT 0 - 35 U/L -    Date of discharge: 03/18/2021 Discharge Diagnoses: Term Pregnancy-delivered Discharge instruction: per After Visit Summary and "Baby and Me Booklet".  After visit meds:   Activity:           unrestricted and pelvic rest Advance as tolerated. Pelvic rest for 6 weeks.  Diet:                routine Medications: PNV and Ibuprofen Postpartum contraception: Progesterone only pills Condition:  Pt discharge to home with baby in stable and condition   Meds: Allergies as of 03/18/2021   No Known Allergies      Medication List     STOP taking these medications    pantoprazole 40 MG tablet Commonly known as: Protonix   ranitidine 150 MG tablet Commonly known as: Zantac   Vitafol Gummies 3.33-0.333-34.8 MG Chew       TAKE these medications    ibuprofen 600 MG tablet Commonly known as: ADVIL Take 1 tablet (600 mg total) by mouth every 6 (six) hours.        Discharge Follow Up:   Follow-up Information     11-15-1978, MD. Schedule an appointment as soon as possible for a visit in 6 week(s).   Specialty: Obstetrics and Gynecology Why: Postpartum check Contact information: [address] [place] [phone]         [facility]. Schedule an appointment as soon as possible for a visit in 6 week(s).   Specialty: Obstetrics and  Gynecology Contact information: [address] [place] [phone]                 [place], NP-C, CNM 03/18/2021, 11:01 AM  03/20/2021, FNP

## 2021-03-25 ENCOUNTER — Ambulatory Visit: Payer: Medicaid Other

## 2022-07-17 IMAGING — US US MFM OB LIMITED
1 series · 14 of 28 positions shown · non-contrast
Comparison: none

[Series 1: us mfm ob limited · 14 of 72 slices shown]
[im 3/72]
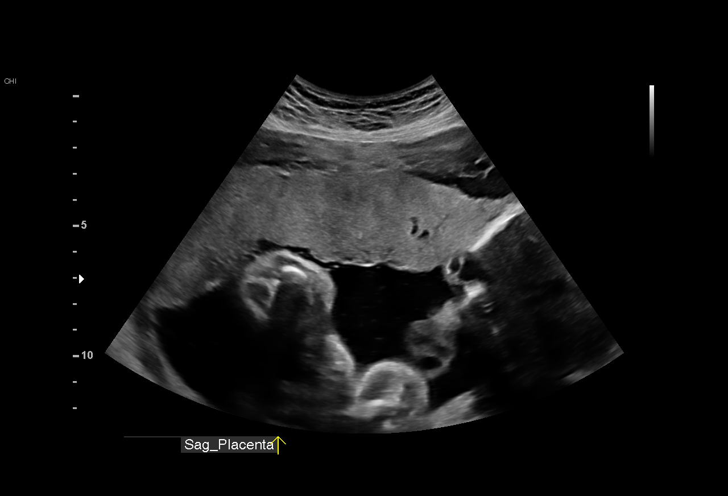
[im 8/72]
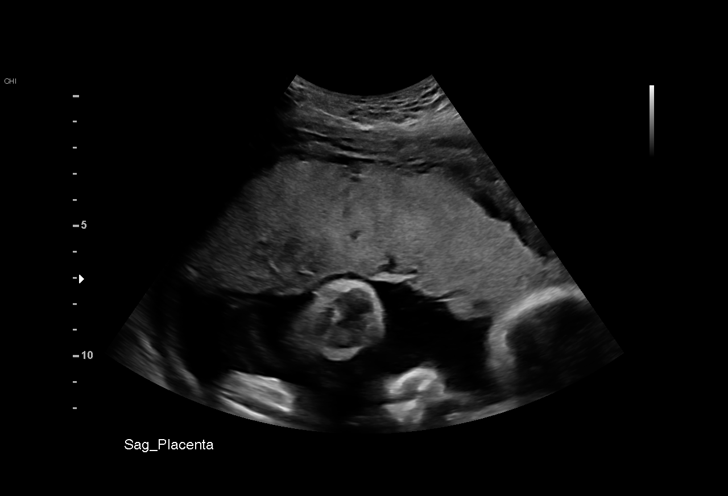
[im 14/72]
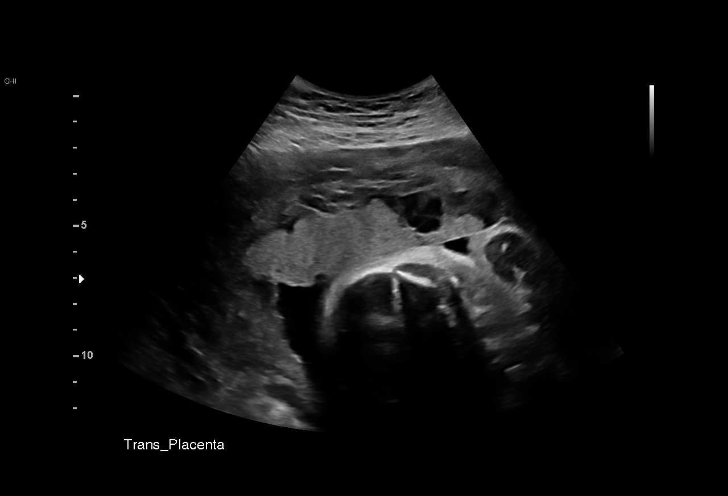
[im 19/72]
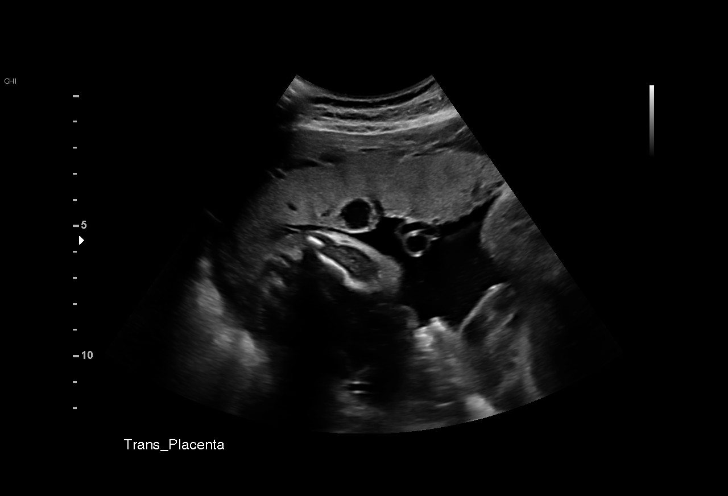
[im 24/72]
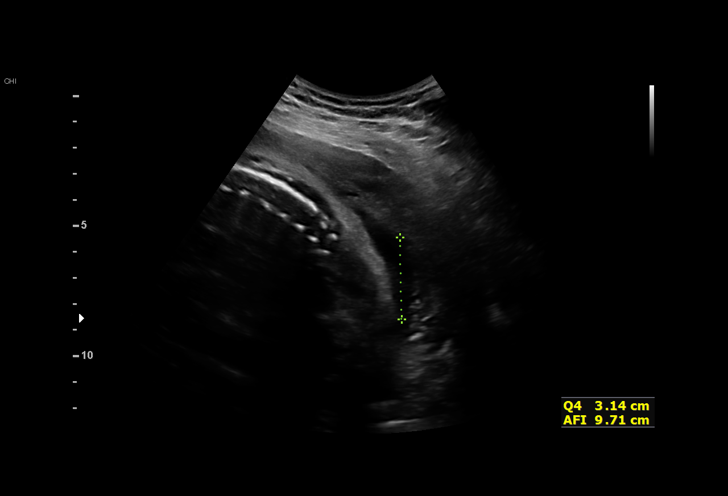
[im 29/72]
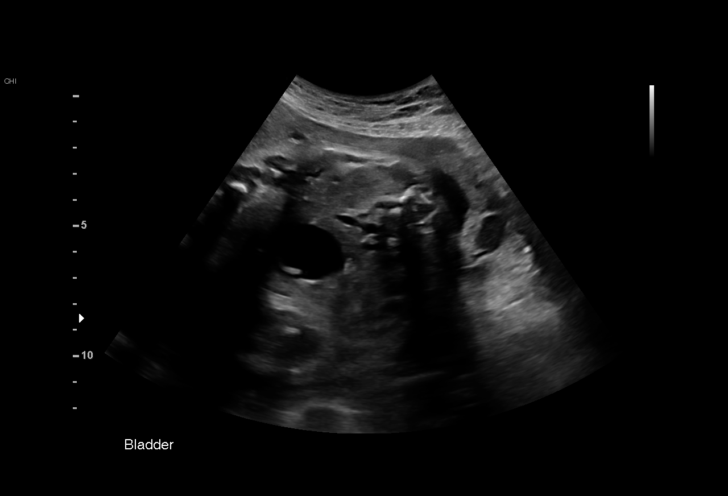
[im 35/72]
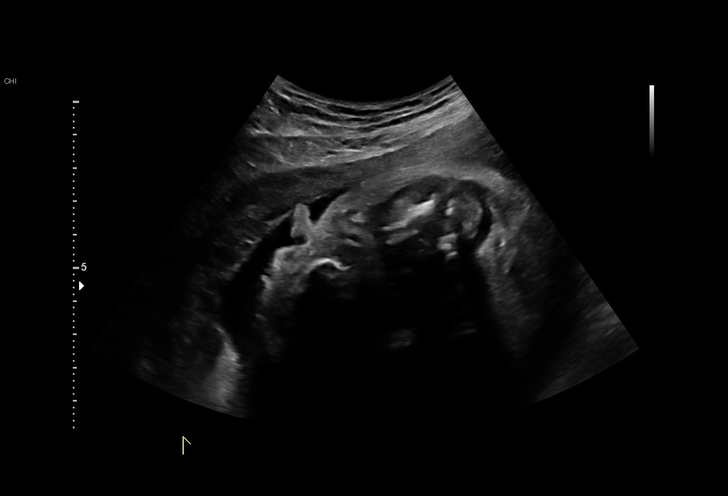
[im 40/72]
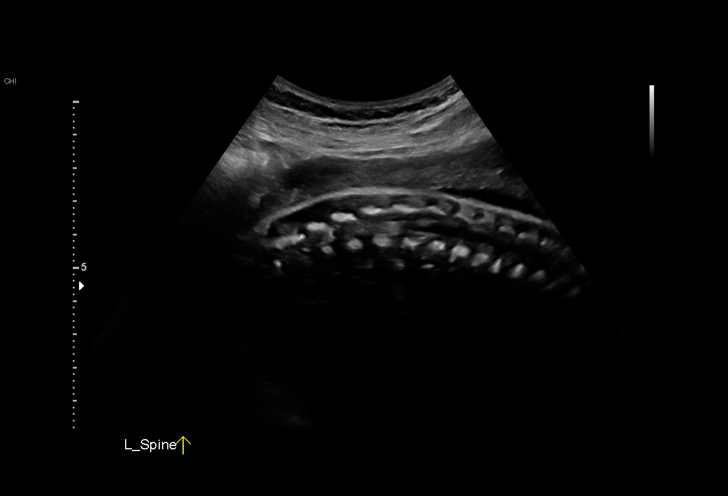
[im 45/72]
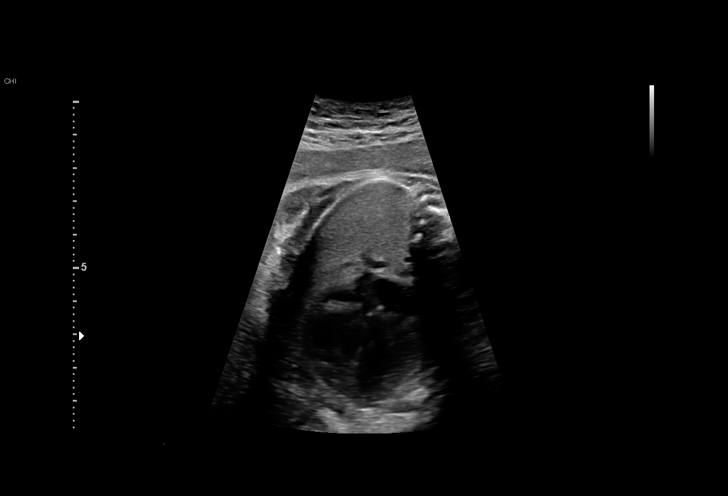
[im 50/72]
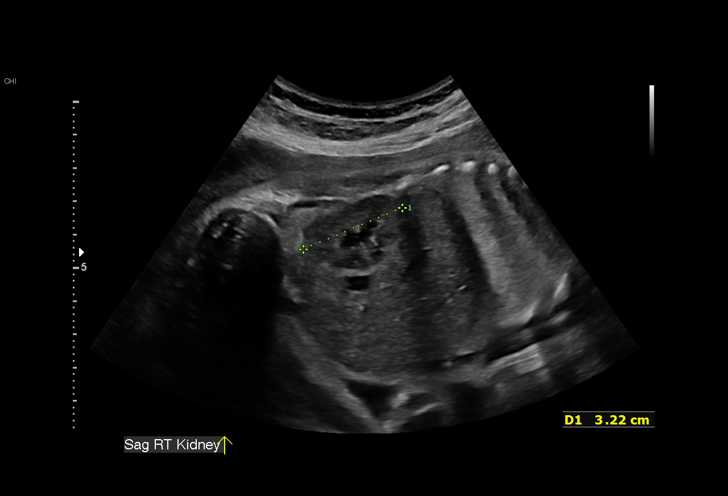
[im 56/72]
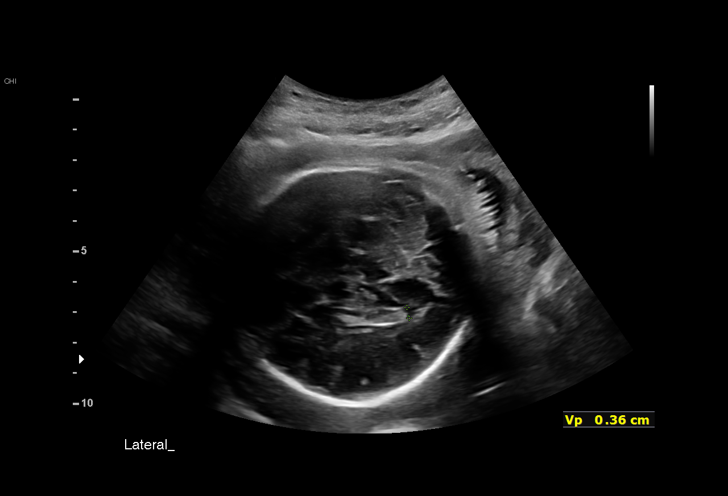
[im 61/72]
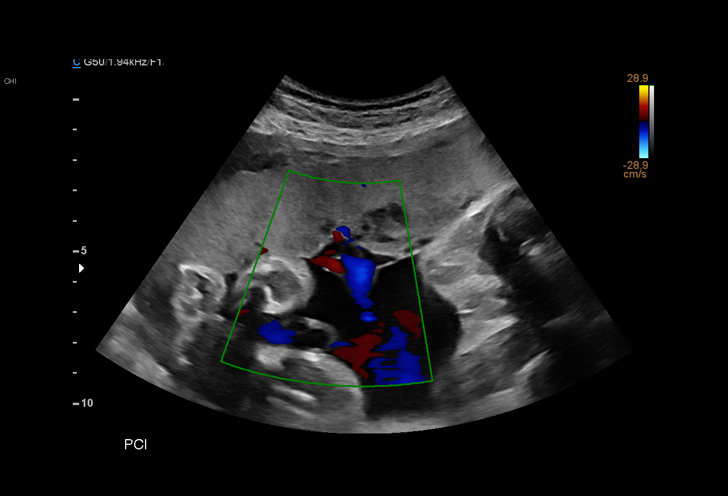
[im 66/72]
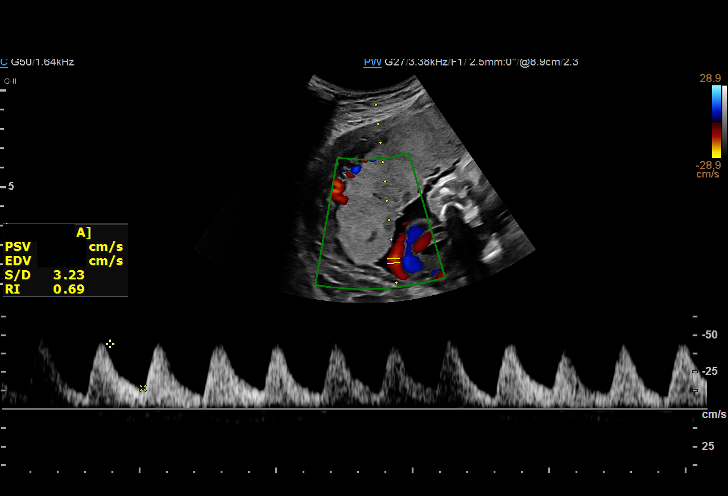
[im 72/72]
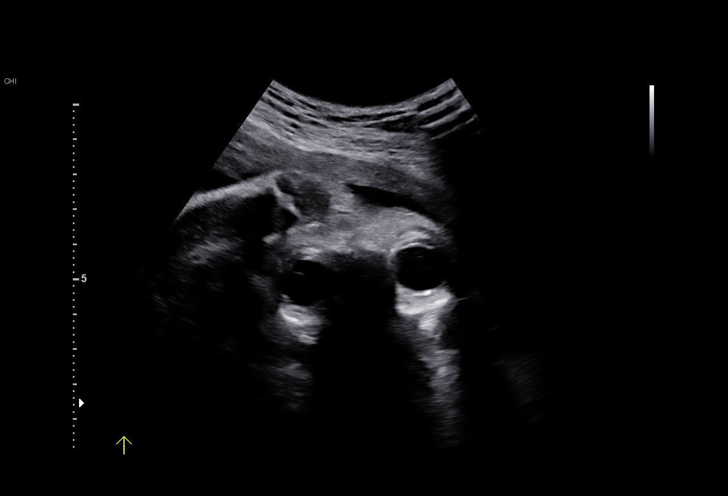

[14 of 28 positions shown; findings below may reference images not displayed]

OB

 1  US MFM OB LIMITED                     76815.01    VINCETIC
                                                      LOCKLEAR
 2  US MFM UA CORD DOPPLER                76820.02    VINCETIC
                                                      LOCKLEAR

Indications

 History of cesarean delivery, currently
 pregnant
 Medical complication of pregnancy (Hip
 Dysplasia)
 LR NIPS/ Negative Horizon
 Encounter for other antenatal screening
 follow-up
 31 weeks gestation of pregnancy
Fetal Evaluation

 Num Of Fetuses:         1
 Fetal Heart Rate(bpm):  147
 Cardiac Activity:       Observed
 Presentation:           Cephalic
 Placenta:               Anterior
 P. Cord Insertion:      Visualized, central

 Amniotic Fluid
 AFI FV:      Within normal limits

 AFI Sum(cm)     %Tile       Largest Pocket(cm)
 10.02           15
 RUQ(cm)       RLQ(cm)       LUQ(cm)

Biometry

 LV:        3.6  mm
OB History

 Blood Type:   O+
 Gravidity:    2         Term:   1        Prem:   0        SAB:   0
 TOP:          0       Ectopic:  0        Living: 1
Gestational Age

 LMP:           31w 3d        Date:  06/21/20                 EDD:   03/28/21
 Best:          31w 3d     Det. By:  LMP  (06/21/20)          EDD:   03/28/21
Anatomy

 Ventricles:            Appears normal         Cord Vessels:           Previously seen
 Lips:                  Previously seen        Kidneys:                Appear normal
 Heart:                 Appears normal;        Bladder:                Appears normal
                        EIF previously
 Stomach:               Appears normal, left   Spine:                  Appears normal
                        sided
Doppler - Fetal Vessels

 Umbilical Artery
  S/D     %tile      RI    %tile                             ADFV    RDFV
  3.47       85    0.71       85                                No      No

Cervix Uterus Adnexa

 Cervix
 Not visualized (advanced GA >96wks)
Impression

 Fetal growth restriction.
 Amniotic fluid is normal and good fetal activity is seen.
 Umbilical artery Doppler showed normal forward diastolic
 flow. NST is reactive.
 We reassured the patient of the findings.
Recommendations

 Continue weekly -BPP and UA Doppler.
                 Leal Morales, Guang[place]

## 2022-08-07 IMAGING — US US MFM FETAL BPP W/O NON-STRESS
1 series · 8 of 8 positions shown · non-contrast
Comparison: none

[Series 1: us mfm fetal bpp w/o non-stress · 8 acquisitions, 8 frames shown]
[im 1/8]
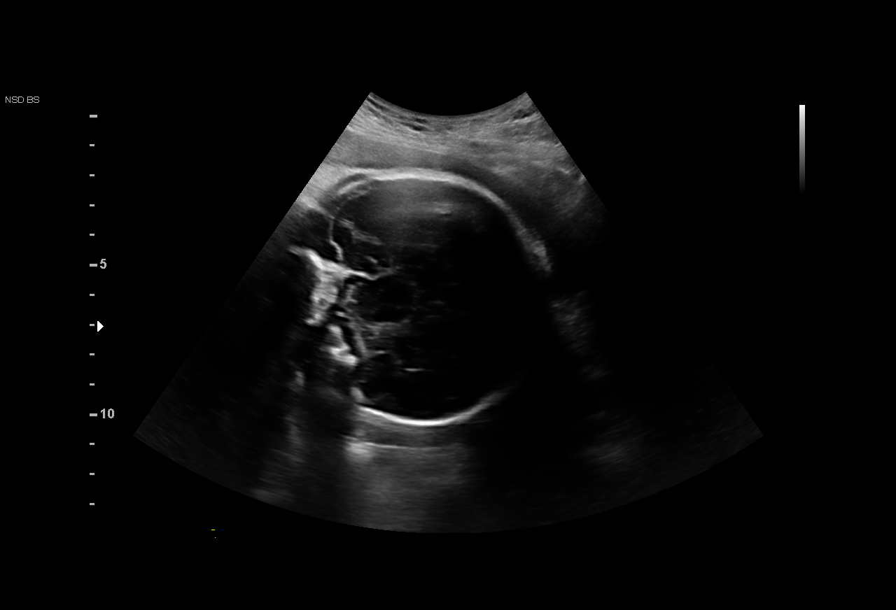
[im 2/8]
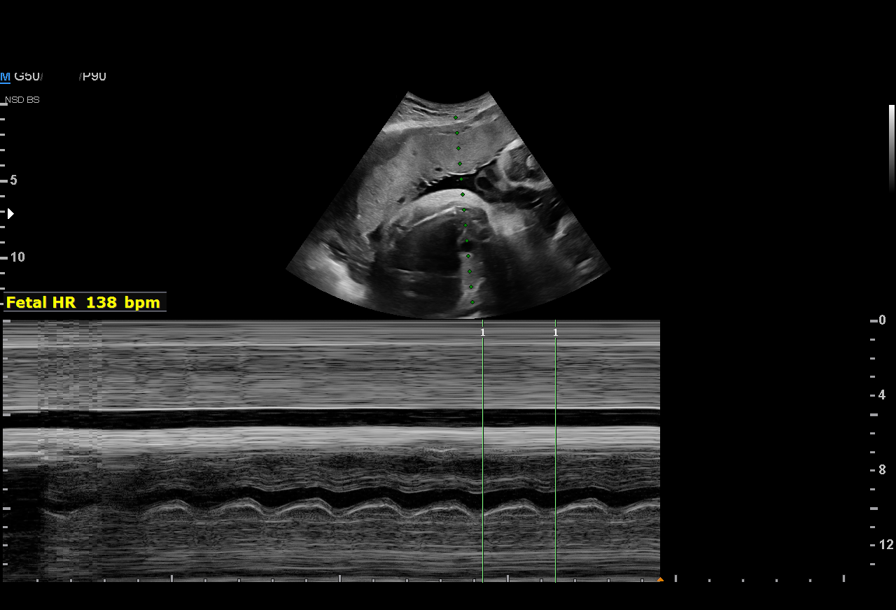
[im 3/8]
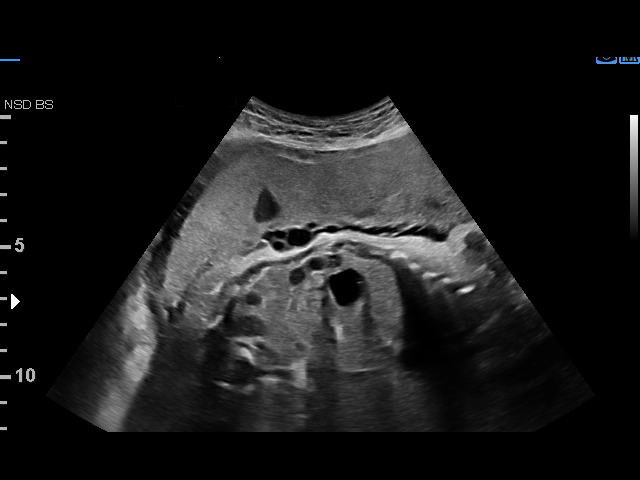
[im 4/8]
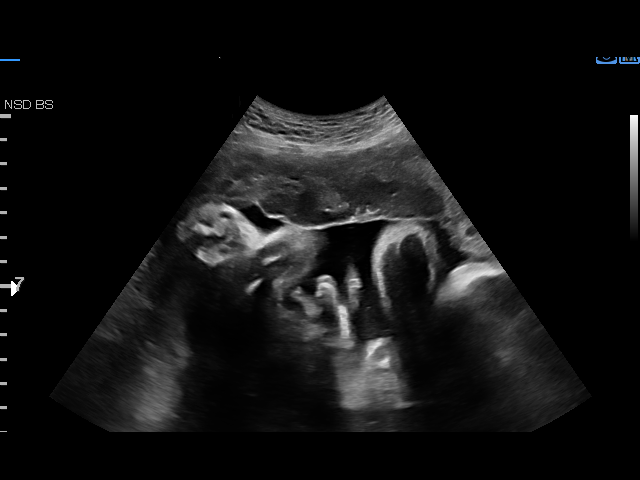
[im 5/8]
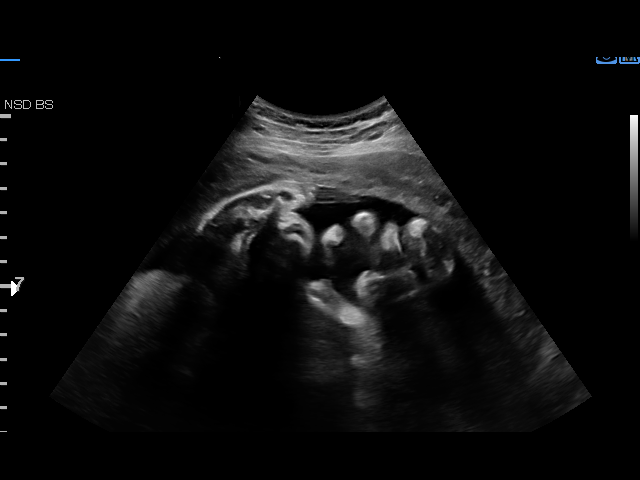
[im 6/8]
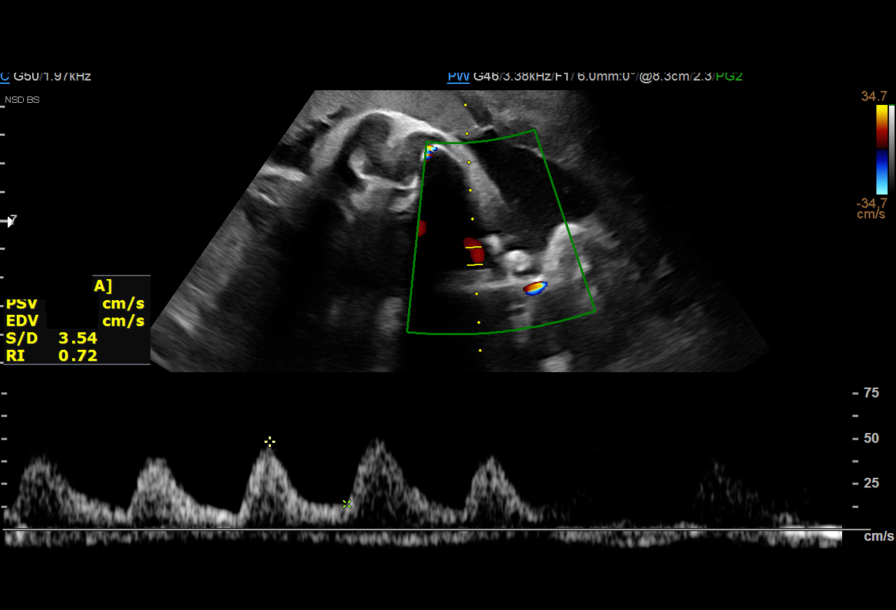
[im 7/8]
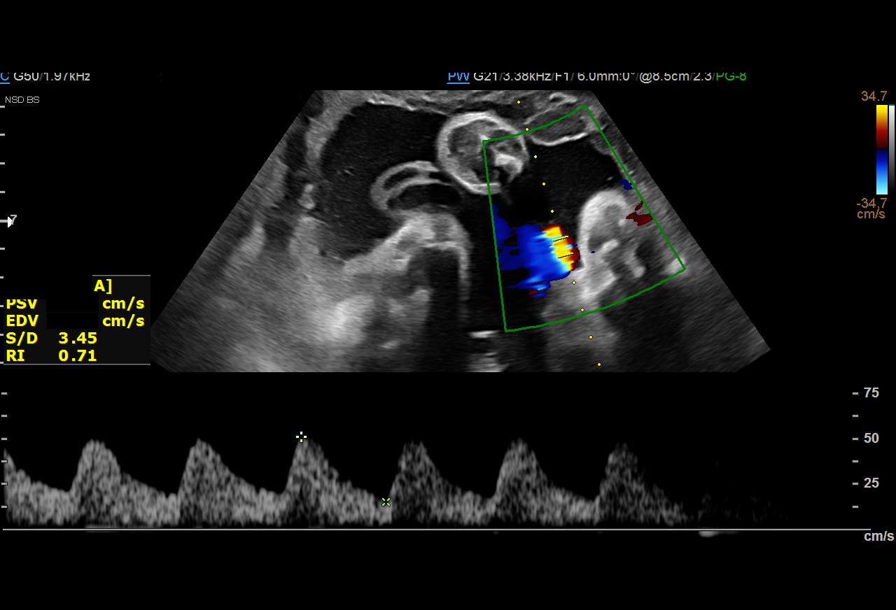
[im 8/8]
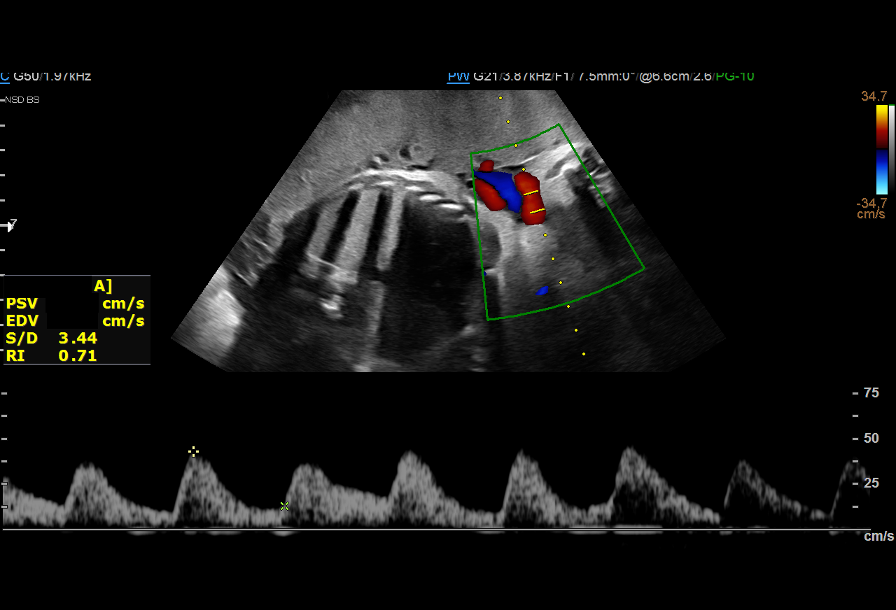

[8 of 8 positions shown; findings below may reference images not displayed]

OB

Indications

 Maternal care for known or suspected poor
 fetal growth, third trimester, not applicable or
 unspecified IUGR
 34 weeks gestation of pregnancy
 History of cesarean delivery, currently
 pregnant
 Medical complication of pregnancy (Hip
 Dysplasia)
 LR NIPS/ Negative Horizon
 Encounter for other antenatal screening
 follow-up
Fetal Evaluation

 Num Of Fetuses:         1
 Fetal Heart Rate(bpm):  138
 Cardiac Activity:       Observed
 Presentation:           Cephalic
 Placenta:               Anterior
 P. Cord Insertion:      Previously Visualized
 Amniotic Fluid
 AFI FV:      Within normal limits

 AFI Sum(cm)     %Tile       Largest Pocket(cm)
 16.65           61

 RUQ(cm)       RLQ(cm)       LUQ(cm)        LLQ(cm)

Biophysical Evaluation

 Amniotic F.V:   Within normal limits       F. Tone:        Observed
 F. Movement:    Observed                   Score:          [DATE]
 F. Breathing:   Observed
OB History

 Blood Type:   O+
 Gravidity:    2         Term:   1        Prem:   0        SAB:   0
 TOP:          0       Ectopic:  0        Living: 1
Gestational Age

 LMP:           34w 3d        Date:  06/21/20                 EDD:   03/28/21
 Best:          34w 3d     Det. By:  LMP  (06/21/20)          EDD:   03/28/21
Doppler - Fetal Vessels

 Umbilical Artery
  S/D     %tile      RI    %tile                             ADFV    RDFV
  3.81       97    0.74       96                                No      No

Impression

 Antenatal testing due to IUGR with an EFW 11th% with an
 AC < 18th%.
 Biophysical profile [DATE] with good fetal movement and
 amniotic fluid volume
 UA Dopplers are elevated with no evidence of AEDF or
 REDF
 Biophysical profile [DATE]

 I discussed today's visit with Ms. Plumer conveying in particular
 the elevated UA Dopplers and recommendation for delivery
 at 37 weeks.

Recommendations

 Continue weekly tesitng with UA dopplers
 Consider delivery at 37 weeks given elevated UA dopplers.

## 2023-12-09 ENCOUNTER — Emergency Department (HOSPITAL_BASED_OUTPATIENT_CLINIC_OR_DEPARTMENT_OTHER)
Admission: EM | Admit: 2023-12-09 | Discharge: 2023-12-09 | Disposition: A | Discharge status: Home / Self Care | Attending: Emergency Medicine | Admitting: Emergency Medicine

## 2023-12-09 ENCOUNTER — Other Ambulatory Visit: Payer: Self-pay

## 2023-12-09 ENCOUNTER — Emergency Department (HOSPITAL_BASED_OUTPATIENT_CLINIC_OR_DEPARTMENT_OTHER)

## 2023-12-09 ENCOUNTER — Encounter (HOSPITAL_BASED_OUTPATIENT_CLINIC_OR_DEPARTMENT_OTHER): Payer: Self-pay

## 2023-12-09 DIAGNOSIS — R1031 Right lower quadrant pain: Secondary | ICD-10-CM | POA: Diagnosis present

## 2023-12-09 LAB — URINALYSIS, ROUTINE W REFLEX MICROSCOPIC
Bilirubin Urine: NEGATIVE
Glucose, UA: NEGATIVE mg/dL
Ketones, ur: NEGATIVE mg/dL
Leukocytes,Ua: NEGATIVE
Nitrite: NEGATIVE
Protein, ur: NEGATIVE mg/dL
Specific Gravity, Urine: 1.011 (ref 1.005–1.030)
pH: 5.5 (ref 5.0–8.0)

## 2023-12-09 LAB — COMPREHENSIVE METABOLIC PANEL
ALT: 17 U/L (ref 0–44)
AST: 22 U/L (ref 15–41)
Albumin: 4.3 g/dL (ref 3.5–5.0)
Alkaline Phosphatase: 41 U/L (ref 38–126)
Anion gap: 5 (ref 5–15)
BUN: 14 mg/dL (ref 6–20)
CO2: 27 mmol/L (ref 22–32)
Calcium: 9.2 mg/dL (ref 8.9–10.3)
Chloride: 103 mmol/L (ref 98–111)
Creatinine, Ser: 0.85 mg/dL (ref 0.44–1.00)
GFR, Estimated: >60 mL/min (ref >60)
Glucose, Bld: 99 mg/dL (ref 70–99)
Potassium: 4 mmol/L (ref 3.5–5.1)
Sodium: 135 mmol/L (ref 135–145)
Total Bilirubin: 0.2 mg/dL (ref 0.0–1.2)
Total Protein: 7.3 g/dL (ref 6.5–8.1)

## 2023-12-09 LAB — CBC WITH DIFFERENTIAL/PLATELET
Abs Immature Granulocytes: 0.01 10*3/uL (ref 0.00–0.07)
Basophils Absolute: 0 10*3/uL (ref 0.0–0.1)
Basophils Relative: 0 %
Eosinophils Absolute: 0.1 10*3/uL (ref 0.0–0.5)
Eosinophils Relative: 1 %
HCT: 39.4 % (ref 36.0–46.0)
Hemoglobin: 12.8 g/dL (ref 12.0–15.0)
Immature Granulocytes: 0 %
Lymphocytes Relative: 11 %
Lymphs Abs: 0.8 10*3/uL (ref 0.7–4.0)
MCH: 27 pg (ref 26.0–34.0)
MCHC: 32.5 g/dL (ref 30.0–36.0)
MCV: 83.1 fL (ref 80.0–100.0)
Monocytes Absolute: 0.7 10*3/uL (ref 0.1–1.0)
Monocytes Relative: 9 %
Neutro Abs: 5.9 10*3/uL (ref 1.7–7.7)
Neutrophils Relative %: 79 %
Platelets: 240 10*3/uL (ref 150–400)
RBC: 4.74 MIL/uL (ref 3.87–5.11)
RDW: 13.4 % (ref 11.5–15.5)
WBC: 7.4 10*3/uL (ref 4.0–10.5)
nRBC: 0 % (ref 0.0–0.2)

## 2023-12-09 LAB — LIPASE, BLOOD: Lipase: 33 U/L (ref 11–51)

## 2023-12-09 LAB — RESP PANEL BY RT-PCR (RSV, FLU A&B, COVID)  RVPGX2
Influenza A by PCR: NEGATIVE
Influenza B by PCR: NEGATIVE
Resp Syncytial Virus by PCR: NEGATIVE
SARS Coronavirus 2 by RT PCR: NEGATIVE

## 2023-12-09 LAB — PREGNANCY, URINE: Preg Test, Ur: NEGATIVE

## 2023-12-09 MED ORDER — FENTANYL CITRATE PF 50 MCG/ML IJ SOSY
50.0000 ug | PREFILLED_SYRINGE | Freq: Once | INTRAMUSCULAR | Status: AC
  Administered 2023-12-09: 50 ug via INTRAVENOUS
  Filled 2023-12-09: qty 1

## 2023-12-09 MED ORDER — LACTATED RINGERS IV BOLUS
1000.0000 mL | Freq: Once | INTRAVENOUS | Status: AC
  Administered 2023-12-09: 1000 mL via INTRAVENOUS

## 2023-12-09 MED ORDER — IOHEXOL 300 MG/ML  SOLN
100.0000 mL | Freq: Once | INTRAMUSCULAR | Status: AC | PRN
  Administered 2023-12-09: 80 mL via INTRAVENOUS

## 2023-12-09 MED ORDER — ONDANSETRON HCL 4 MG/2ML IJ SOLN
4.0000 mg | Freq: Once | INTRAMUSCULAR | Status: AC
  Administered 2023-12-09: 4 mg via INTRAVENOUS
  Filled 2023-12-09: qty 2

## 2023-12-09 NOTE — Discharge Instructions (Signed)
 Your laboratory evaluation and CT imaging was overall reassuring.  Please follow-up with your primary care provider regarding your discomfort as needed.  Return for any severe worsening symptoms.

## 2023-12-09 NOTE — ED Provider Notes (Signed)
  Physical Exam  BP 109/82 (BP Location: Left Arm)   Pulse 67   Temp 97.9 F (36.6 C) (Oral)   Resp 18   Ht 1.524 m (5')   Wt 51.3 kg   LMP 12/09/2023   SpO2 100%   BMI 22.07 kg/m   Physical Exam  Procedures  Procedures  ED Course / MDM    Medical Decision Making Amount and/or Complexity of Data Reviewed Labs: ordered. Radiology: ordered.  Risk Prescription drug management.   38 yo female with rlq pain with negative ct of the abdomen,now pending [place] to assess for torsion.  Low index of suspicion for infection. Plan d/c if negative. 8:23 AM Ultrasound tech arrived to take patient to ultrasound and patient told her that she did not want to have study. I discussed indications with the patient.  We discussed risks and benefits.  Specifically, we discussed the loss of an ovary, severe infection, or other severe pelvic conditions that could cause her morbidity including losing her ovary and severe infections.  Patient continues to not wish to proceed with study.  We have discussed return precautions that she is welcome to come back at any time.        Margarita Grizzle, MD 12/09/23 [phone]

## 2023-12-09 NOTE — ED Triage Notes (Signed)
 Periumbilical abdominal pains that radiate to RLQ. Pain first manifested yesterday morning ~3am but went away and returned this morning.   Nausea but denies any vomiting.

## 2023-12-09 NOTE — ED Provider Notes (Signed)
 [facility] EMERGENCY DE[facility]MENT AT [facility] Provider Note   CSN: [id number] Arrival date & time: 12/09/23  0357     History  Chief Complaint  Patient presents with   Abdominal Pain    Kaitlyn Robinson is a 38 y.o. female.   Abdominal Pain Associated symptoms: chills and nausea      38 year old female presenting to the emergency department with abdominal pain.  The patient states that yesterday morning when she woke up she had periumbilical abdominal pain.  She had anorexia throughout the day not wanting to eat much with associated nausea and vague generalized abdominal discomfort.  It went away and then recurred yesterday evening and subsequently woke her up and spilled from her sleep.  She since has noted a migratory component down to the right lower quadrant.  Pain came on gradually throughout the day yesterday.  She feels associated chills.  She is currently undergoing her menstrual cycle.  She denies any dysuria or frequency or abnormal vaginal discharge.  Home Medications Prior to Admission medications   Medication Sig Start Date End Date Taking? Authorizing Provider  ibuprofen (ADVIL) 600 MG tablet Take 1 tablet (600 mg total) by mouth every 6 (six) hours. 03/18/21   Dale Spaulding, FNP      Allergies    Patient has no known allergies.    Review of Systems   Review of Systems  Constitutional:  Positive for appetite change and chills.  Gastrointestinal:  Positive for abdominal pain and nausea.  All other systems reviewed and are negative.   Physical Exam Updated Vital Signs BP 109/82 (BP Location: Left Arm)   Pulse 80   Temp 97.9 F (36.6 C) (Oral)   Resp 18   Ht 5' (1.524 m)   Wt 51.3 kg   LMP 12/09/2023   SpO2 97%   BMI 22.07 kg/m  Physical Exam Vitals and nursing note reviewed.  Constitutional:      General: She is not in acute distress.    Appearance: She is well-developed.  HENT:     Head: Normocephalic and atraumatic.  Eyes:      Conjunctiva/sclera: Conjunctivae normal.  Cardiovascular:     Rate and Rhythm: Normal rate and regular rhythm.     Heart sounds: No murmur heard. Pulmonary:     Effort: Pulmonary effort is normal. No respiratory distress.     Breath sounds: Normal breath sounds.  Abdominal:     Palpations: Abdomen is soft.     Tenderness: There is abdominal tenderness in the right lower quadrant. There is no guarding or rebound.  Musculoskeletal:        General: No swelling.     Cervical back: Neck supple.  Skin:    General: Skin is warm and dry.     Capillary Refill: Capillary refill takes less than 2 seconds.  Neurological:     Mental Status: She is alert.  Psychiatric:        Mood and Affect: Mood normal.     ED Results / Procedures / Treatments   Labs (all labs ordered are listed, but only abnormal results are displayed) Labs Reviewed  URINALYSIS, ROUTINE W REFLEX MICROSCOPIC - Abnormal; Notable for the following components:      Result Value   Color, Urine COLORLESS (*)    Hgb urine dipstick SMALL (*)    Bacteria, UA RARE (*)    All other components within normal limits  RESP PANEL BY RT-PCR (RSV, FLU A&B, COVID)  RVPGX2  LIPASE,  BLOOD  COMPREHENSIVE METABOLIC PANEL  PREGNANCY, URINE  CBC WITH DIFFERENTIAL/PLATELET    EKG None  Radiology CT ABDOMEN PELVIS W CONTRAST Result Date: 12/09/2023 CLINICAL DATA:  38 year old female with recurrent right lower quadrant abdominal pain. Nausea. EXAM: CT ABDOMEN AND PELVIS WITH CONTRAST TECHNIQUE: Multidetector CT imaging of the abdomen and pelvis was performed using the standard protocol following bolus administration of intravenous contrast. RADIATION DOSE REDUCTION: This exam was performed according to the departmental dose-optimization program which includes automated exposure control, adjustment of the mA and/or kV according to patient size and/or use of iterative reconstruction technique. CONTRAST:  80mL OMNIPAQUE IOHEXOL 300 MG/ML  SOLN  COMPARISON:  None Available. FINDINGS: Lower chest: Minimal atelectasis in the left lower lobe. Otherwise normal. No pericardial or pleural effusion. Hepatobiliary: Negative liver and gallbladder. Pancreas: Negative. Spleen: Negative. Adrenals/Urinary Tract: Normal adrenal glands. Symmetric renal enhancement and appearance of extrarenal pelves (normal variant) without convincing hydronephrosis. No evidence of pararenal inflammation, and the proximal ureters are decompressed. Unremarkable urinary bladder. No urinary calculus is evident. No delayed renal excretory images. Stomach/Bowel: Negative rectosigmoid colon. Mild retained stool in the upstream large bowel, including in redundant transverse colon. Normal appendix on coronal image 50 and no large bowel inflammation identified. Nearby terminal ileum is unremarkable. Fluid containing but nondilated small bowel. Stomach, duodenum, and proximal jejunum are decompressed. No free air, free fluid, or mesenteric inflammation identified. Vascular/Lymphatic: Major arterial structures in the portal venous system appear normal. No lymphadenopathy identified. Reproductive: Within normal limits. Other: No pelvis free fluid. Musculoskeletal: Negative. IMPRESSION: Normal appendix. No acute or inflammatory process identified in the abdomen or pelvis. Electronically Signed   By: Odessa Fleming M.D.   On: 12/09/2023 06:22    Procedures Procedures    Medications Ordered in ED Medications  fentaNYL (SUBLIMAZE) injection 50 mcg (50 mcg Intravenous Given 12/09/23 0456)  ondansetron (ZOFRAN) injection 4 mg (4 mg Intravenous Given 12/09/23 0456)  lactated ringers bolus 1,000 mL (1,000 mLs Intravenous New Bag/Given 12/09/23 0458)  iohexol (OMNIPAQUE) 300 MG/ML solution 100 mL (80 mLs Intravenous Contrast Given 12/09/23 0606)    ED Course/ Medical Decision Making/ A&P                                 Medical Decision Making Amount and/or Complexity of Data Reviewed Labs:  ordered. Radiology: ordered.  Risk Prescription drug management.     38 year old female presenting to the emergency department with abdominal pain.  The patient states that yesterday morning when she woke up she had periumbilical abdominal pain.  She had anorexia throughout the day not wanting to eat much with associated nausea and vague generalized abdominal discomfort.  It went away and then recurred yesterday evening and subsequently woke her up and spilled from her sleep.  She since has noted a migratory component down to the right lower quadrant.  Pain came on gradually throughout the day yesterday.  She feels associated chills.  She is currently undergoing her menstrual cycle.  She denies any dysuria or frequency or abnormal vaginal discharge.  Medical Decision Making:   Kaitlyn Robinson is a 38 y.o. female who presented to the ED today with abdominal pain, detailed above.    Patient placed on continuous vitals and telemetry monitoring while in ED which was reviewed periodically.  Complete initial physical exam performed, notably the patient  was TTP in the RLQ.     Reviewed and confirmed nursing documentation  for past medical history, family history, social history.    Initial Assessment:   With the patient's presentation of abdominal pain, most likely diagnosis is appendicitis. Other diagnoses were considered including (but not limited to) gastroenteritis, colitis, small bowel obstruction, appendicitis, cholecystitis, pancreatitis, nephrolithiasis, UTI, pyelonephritis, diverticulitis ruptured ectopic pregnancy, PID, ovarian torsion. These are considered less likely due to history of present illness and physical exam findings.      Initial Plan:  CBC/CMP to evaluate for underlying infectious/metabolic etiology for patient's abdominal pain  Lipase to evaluate for pancreatitis  EKG to evaluate for cardiac source of pain  CTAB/Pelvis with contrast to evaluate for structural/surgical etiology of  patients' severe abdominal pain.  Urinalysis and repeat physical assessment to evaluate for UTI/Pyelonpehritis  Empiric management of symptoms with escalating pain control and antiemetics as needed.   Initial Study Results:   Laboratory  All laboratory results reviewed without evidence of clinically relevant pathology.   Exceptions include: UA with small hemoglobin however the patient is currently on her menstrual cycle, rare bacteria, unconvincing for UTI, negative nitrites and leukocytes, 0-5 WBCs.   Radiology All images reviewed independently. Agree with radiology report at this time.   CT ABDOMEN PELVIS W CONTRAST Result Date: 12/09/2023 CLINICAL DATA:  38 year old female with recurrent right lower quadrant abdominal pain. Nausea. EXAM: CT ABDOMEN AND PELVIS WITH CONTRAST TECHNIQUE: Multidetector CT imaging of the abdomen and pelvis was performed using the standard protocol following bolus administration of intravenous contrast. RADIATION DOSE REDUCTION: This exam was performed according to the departmental dose-optimization program which includes automated exposure control, adjustment of the mA and/or kV according to patient size and/or use of iterative reconstruction technique. CONTRAST:  80mL OMNIPAQUE IOHEXOL 300 MG/ML  SOLN COMPARISON:  None Available. FINDINGS: Lower chest: Minimal atelectasis in the left lower lobe. Otherwise normal. No pericardial or pleural effusion. Hepatobiliary: Negative liver and gallbladder. Pancreas: Negative. Spleen: Negative. Adrenals/Urinary Tract: Normal adrenal glands. Symmetric renal enhancement and appearance of extrarenal pelves (normal variant) without convincing hydronephrosis. No evidence of pararenal inflammation, and the proximal ureters are decompressed. Unremarkable urinary bladder. No urinary calculus is evident. No delayed renal excretory images. Stomach/Bowel: Negative rectosigmoid colon. Mild retained stool in the upstream large bowel, including in  redundant transverse colon. Normal appendix on coronal image 50 and no large bowel inflammation identified. Nearby terminal ileum is unremarkable. Fluid containing but nondilated small bowel. Stomach, duodenum, and proximal jejunum are decompressed. No free air, free fluid, or mesenteric inflammation identified. Vascular/Lymphatic: Major arterial structures in the portal venous system appear normal. No lymphadenopathy identified. Reproductive: Within normal limits. Other: No pelvis free fluid. Musculoskeletal: Negative. IMPRESSION: Normal appendix. No acute or inflammatory process identified in the abdomen or pelvis. Electronically Signed   By: Odessa Fleming M.D.   On: 12/09/2023 06:22     Final Reassessment and Plan:   On repeat assessment, the patient was feeling symptomatically improved, overall reassuring workup with reassuring labs and imaging findings.  Patient is having pain in her right lower quadrant close to the pelvis, considered ovarian torsion as the etiology and discussed this with the patient, will order ultrasound of the ovaries with Doppler to evaluate for torsion.  Signout given to Dr. Rosalia Hammers at 0700 pending results of ultrasound imaging, p.o. challenge and likely plan for discharge home.  Final Clinical Impression(s) / ED Diagnoses Final diagnoses:  Right lower quadrant abdominal pain    Rx / DC Orders ED Discharge Orders     None  Ernie Avena, MD 12/09/23 [phone]
